# Patient Record
Sex: Female | Born: 1980 | Race: White | Hispanic: No | Marital: Single | Smoking: Never smoker
Health system: Southern US, Community
[De-identification: ages and names within clinical notes are randomized; demographics above are authoritative.]

## PROBLEM LIST (undated history)

## (undated) DIAGNOSIS — E669 Obesity, unspecified: Secondary | ICD-10-CM

## (undated) DIAGNOSIS — I10 Essential (primary) hypertension: Secondary | ICD-10-CM

## (undated) DIAGNOSIS — Z87442 Personal history of urinary calculi: Secondary | ICD-10-CM

## (undated) DIAGNOSIS — J40 Bronchitis, not specified as acute or chronic: Secondary | ICD-10-CM

## (undated) HISTORY — PX: TONSILLECTOMY: SUR1361

---
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
## 1998-01-24 ENCOUNTER — Ambulatory Visit (HOSPITAL_COMMUNITY): Admission: RE | Admit: 1998-01-24 | Discharge: 1998-01-24 | Payer: Self-pay | Admitting: Obstetrics and Gynecology

## 1998-02-19 ENCOUNTER — Inpatient Hospital Stay (HOSPITAL_COMMUNITY): Admission: AD | Admit: 1998-02-19 | Discharge: 1998-03-08 | Payer: Self-pay | Admitting: Obstetrics and Gynecology

## 1998-03-06 ENCOUNTER — Encounter (HOSPITAL_COMMUNITY): Admission: RE | Admit: 1998-03-06 | Discharge: 1998-06-04 | Payer: Self-pay | Admitting: Obstetrics and Gynecology

## 2003-01-20 ENCOUNTER — Emergency Department (HOSPITAL_COMMUNITY): Admission: AD | Admit: 2003-01-20 | Discharge: 2003-01-20 | Payer: Self-pay | Admitting: Family Medicine

## 2003-01-22 ENCOUNTER — Emergency Department (HOSPITAL_COMMUNITY): Admission: AD | Admit: 2003-01-22 | Discharge: 2003-01-22 | Payer: Self-pay | Admitting: Family Medicine

## 2003-01-29 ENCOUNTER — Emergency Department (HOSPITAL_COMMUNITY): Admission: AD | Admit: 2003-01-29 | Discharge: 2003-01-29 | Payer: Self-pay | Admitting: Family Medicine

## 2003-02-28 ENCOUNTER — Emergency Department (HOSPITAL_COMMUNITY): Admission: AD | Admit: 2003-02-28 | Discharge: 2003-02-28 | Payer: Self-pay | Admitting: Family Medicine

## 2003-07-01 ENCOUNTER — Emergency Department (HOSPITAL_COMMUNITY): Admission: EM | Admit: 2003-07-01 | Discharge: 2003-07-01 | Payer: Self-pay | Admitting: Family Medicine

## 2005-06-18 ENCOUNTER — Other Ambulatory Visit: Admission: RE | Admit: 2005-06-18 | Discharge: 2005-06-18 | Payer: Self-pay | Admitting: Internal Medicine

## 2005-10-23 ENCOUNTER — Emergency Department (HOSPITAL_COMMUNITY): Admission: EM | Admit: 2005-10-23 | Discharge: 2005-10-23 | Payer: Self-pay | Admitting: Emergency Medicine

## 2007-04-06 ENCOUNTER — Emergency Department (HOSPITAL_COMMUNITY): Admission: EM | Admit: 2007-04-06 | Discharge: 2007-04-06 | Payer: Self-pay | Admitting: Family Medicine

## 2008-01-25 ENCOUNTER — Emergency Department (HOSPITAL_COMMUNITY): Admission: EM | Admit: 2008-01-25 | Discharge: 2008-01-25 | Payer: Self-pay | Admitting: Emergency Medicine

## 2009-10-05 ENCOUNTER — Emergency Department (HOSPITAL_COMMUNITY): Admission: EM | Admit: 2009-10-05 | Discharge: 2009-10-05 | Payer: Self-pay | Admitting: Family Medicine

## 2009-12-14 ENCOUNTER — Emergency Department (HOSPITAL_COMMUNITY): Admission: EM | Admit: 2009-12-14 | Discharge: 2009-12-15 | Payer: Self-pay | Admitting: Emergency Medicine

## 2010-05-23 LAB — POCT URINALYSIS DIP (DEVICE)
Glucose, UA: NEGATIVE mg/dL
Hgb urine dipstick: NEGATIVE
Protein, ur: NEGATIVE mg/dL
Specific Gravity, Urine: >=1.03 (ref 1.005–1.030)
pH: 5.5 (ref 5.0–8.0)

## 2010-05-23 LAB — POCT I-STAT, CHEM 8
BUN: 15 mg/dL (ref 6–23)
Creatinine, Ser: 1.2 mg/dL (ref 0.4–1.2)
Hemoglobin: 13.9 g/dL (ref 12.0–15.0)
Potassium: 4.1 mEq/L (ref 3.5–5.1)
TCO2: 28 mmol/L (ref 0–100)

## 2010-05-23 LAB — POCT PREGNANCY, URINE: Preg Test, Ur: NEGATIVE

## 2010-07-24 NOTE — Consult Note (Signed)
NAME:  [name], [name] NO.:  [id number]   MEDICAL RECORD NO.:  [id number]          PATIENT TYPE:  EMS   LOCATION:  [facility]                         FACILITY:  [facility]   PHYSICIAN:  Kristine Garbe. Ezzard Standing, M.D.DATE OF BIRTH:  1980-11-18   DATE OF CONSULTATION:  10/23/2005  DATE OF DISCHARGE:  10/23/2005                                   CONSULTATION   REASON FOR CONSULTATION:  Patient with left ear laceration.  Barbara Giles  is a 30 year old female who was hit in left side of her head and left ear  with a golf club accidentally.  She sustained a through-and-through  laceration of the left ear measuring approximately 3-1/2 cm in size.  Cartilage was exposed and torn.  I was called to repair the left ear  laceration.   DESCRIPTION OF PROCEDURE:  Left ear was cleaned and prepped with Betadine  solution.  The ear was then injected with 3 mL of Xylocaine with  epinephrine.  The cartilage was reapproximated with a single 6-0 Vicryl  suture.  The skin edges were then reapproximated with 6-0 nylon sutures.  Jaretssi tolerated this well.  Bacitracin ointment and a mastoid-type  dressing was applied to the ear for light pressure.  She tolerated this well  and was discharged home subsequently.  She is given Keflex 500 mg b.i.d. for  5 days, Tylenol p.r.n. pain.  Will have a follow up in my offices in 6 days  for recheck and have the sutures removed.           ______________________________  Kristine Garbe Ezzard Standing, M.D.     CEN/MEDQ  D:  10/23/2005  T:  10/23/2005  Job:  [id number]

## 2010-09-07 ENCOUNTER — Other Ambulatory Visit (HOSPITAL_COMMUNITY)
Admission: RE | Admit: 2010-09-07 | Discharge: 2010-09-07 | Disposition: A | Discharge status: Home / Self Care | Payer: Self-pay | Source: Ambulatory Visit | Attending: Family Medicine | Admitting: Family Medicine

## 2010-09-07 ENCOUNTER — Other Ambulatory Visit: Payer: Self-pay | Admitting: Family Medicine

## 2010-09-07 DIAGNOSIS — Z124 Encounter for screening for malignant neoplasm of cervix: Secondary | ICD-10-CM | POA: Insufficient documentation

## 2010-09-07 DIAGNOSIS — Z1159 Encounter for screening for other viral diseases: Secondary | ICD-10-CM | POA: Insufficient documentation

## 2010-12-09 LAB — CBC
HCT: 39.3
Hemoglobin: 13.3
MCHC: 33.7
MCV: 83.6
Platelets: 291
RDW: 13

## 2010-12-09 LAB — POCT URINALYSIS DIP (DEVICE)
Nitrite: NEGATIVE
Urobilinogen, UA: 0.2

## 2010-12-09 LAB — POCT PREGNANCY, URINE: Preg Test, Ur: NEGATIVE

## 2010-12-09 LAB — COMPREHENSIVE METABOLIC PANEL
Albumin: 4.1
Alkaline Phosphatase: 62
Calcium: 9.3
Chloride: 102
Creatinine, Ser: 0.95
Glucose, Bld: 114 — ABNORMAL HIGH
Sodium: 135

## 2010-12-09 LAB — DIFFERENTIAL
Basophils Absolute: 0
Eosinophils Absolute: 0
Eosinophils Relative: 0
Lymphocytes Relative: 7 — ABNORMAL LOW
Monocytes Absolute: 0.3
Monocytes Relative: 2 — ABNORMAL LOW

## 2011-02-02 ENCOUNTER — Emergency Department (HOSPITAL_COMMUNITY)
Admission: EM | Admit: 2011-02-02 | Discharge: 2011-02-02 | Discharge status: Against Medical Advice | Payer: Self-pay | Attending: Emergency Medicine | Admitting: Emergency Medicine

## 2011-02-02 DIAGNOSIS — R0602 Shortness of breath: Secondary | ICD-10-CM | POA: Insufficient documentation

## 2011-02-21 ENCOUNTER — Emergency Department (HOSPITAL_COMMUNITY): Payer: PRIVATE HEALTH INSURANCE

## 2011-02-21 ENCOUNTER — Encounter: Payer: Self-pay | Admitting: *Deleted

## 2011-02-21 ENCOUNTER — Emergency Department (HOSPITAL_COMMUNITY)
Admission: EM | Admit: 2011-02-21 | Discharge: 2011-02-21 | Disposition: A | Discharge status: Home / Self Care | Payer: PRIVATE HEALTH INSURANCE | Attending: Emergency Medicine | Admitting: Emergency Medicine

## 2011-02-21 DIAGNOSIS — M25569 Pain in unspecified knee: Secondary | ICD-10-CM | POA: Insufficient documentation

## 2011-02-21 DIAGNOSIS — E669 Obesity, unspecified: Secondary | ICD-10-CM | POA: Insufficient documentation

## 2011-02-21 DIAGNOSIS — M25561 Pain in right knee: Secondary | ICD-10-CM

## 2011-02-21 DIAGNOSIS — S8000XA Contusion of unspecified knee, initial encounter: Secondary | ICD-10-CM | POA: Insufficient documentation

## 2011-02-21 DIAGNOSIS — Y9239 Other specified sports and athletic area as the place of occurrence of the external cause: Secondary | ICD-10-CM | POA: Insufficient documentation

## 2011-02-21 DIAGNOSIS — W010XXA Fall on same level from slipping, tripping and stumbling without subsequent striking against object, initial encounter: Secondary | ICD-10-CM | POA: Insufficient documentation

## 2011-02-21 DIAGNOSIS — S86919A Strain of unspecified muscle(s) and tendon(s) at lower leg level, unspecified leg, initial encounter: Secondary | ICD-10-CM

## 2011-02-21 DIAGNOSIS — Y9354 Activity, bowling: Secondary | ICD-10-CM | POA: Insufficient documentation

## 2011-02-21 DIAGNOSIS — S838X9A Sprain of other specified parts of unspecified knee, initial encounter: Secondary | ICD-10-CM | POA: Insufficient documentation

## 2011-02-21 MED ORDER — OXYCODONE-ACETAMINOPHEN 5-325 MG PO TABS: 2.0000 | ORAL_TABLET | ORAL | Status: AC | PRN

## 2011-02-21 MED ORDER — OXYCODONE-ACETAMINOPHEN 5-325 MG PO TABS
2.0000 | ORAL_TABLET | Freq: Once | ORAL | Status: AC
  Administered 2011-02-21: 2 via ORAL
  Filled 2011-02-21: qty 2

## 2011-02-21 NOTE — ED Notes (Signed)
Patient transported to X-ray 

## 2011-02-21 NOTE — ED Notes (Signed)
Patient fell PTA onto her back and injured her right knee.  Patient states that she is not able to bear weight on that right knee.  When she stands up, " it feels like jelly."

## 2011-02-21 NOTE — ED Provider Notes (Signed)
History     CSN: [id number] Arrival date & time: 02/21/2011  1:34 AM   First MD Initiated Contact with Patient 02/21/11 [phone]      Chief Complaint  Patient presents with  . Fall    (Consider location/radiation/quality/duration/timing/severity/associated sxs/prior treatment) HPI 30 yo female presents to the ER after having a fall while bowling and injuring her right knee.  Pt reports she felt it "go out" and fell down.  No prior h/o knee injuries.  Pt has been unable to bear weight on the knee due to "feeling like its jelly".  No swelling noted by patient.  Pain mainly on lateral aspect of knee and over patella.   History reviewed. No pertinent past medical history.  Past Surgical History  Procedure Date  . Tonsillectomy     History reviewed. No pertinent family history.  History  Substance Use Topics  . Smoking status: Never Smoker   . Smokeless tobacco: Not on file  . Alcohol Use: Yes    OB History    Grav Para Term Preterm Abortions TAB SAB Ect Mult Living                  Review of Systems  All other systems reviewed and are negative.    Allergies  Review of patient's allergies indicates no known allergies.  Home Medications   Current Outpatient Rx  Name Route Sig Dispense Refill  . NAPROXEN SODIUM 220 MG PO TABS Oral Take 220 mg by mouth 2 (two) times daily with a meal. For headache     . OXYCODONE-ACETAMINOPHEN 5-325 MG PO TABS Oral Take 2 tablets by mouth every 4 (four) hours as needed for pain. 30 tablet 0    BP 116/68  Pulse 94  Temp(Src) 98.2 F (36.8 C) (Oral)  Resp 20  SpO2 96%  LMP 01/27/2011  Physical Exam  Constitutional: She is oriented to person, place, and time.       Obese female in mild distress secondary to pain  HENT:  Head: Normocephalic and atraumatic.  Eyes: Conjunctivae and EOM are normal.  Musculoskeletal:       Right knee with 2 cm area of ecchymosis to lateral aspect.  No effusion, no deformity noted, no crepitus on  examination.  Normal ROM, but painful at endpoints of extension/flexion.  Normal anterior/posterior drawer.  No pain with varus/valgus stress.  Unable to complete lachmans due to body habitus and patient tolerance.  No patellar deformity no pain over patellar tendon.  Mild lateral joint line tenderness  Neurological: She is alert and oriented to person, place, and time. She exhibits normal muscle tone. Coordination normal.  Skin: Skin is warm and dry. No rash noted. No erythema. No pallor.  Psychiatric: She has a normal mood and affect. Her behavior is normal. Judgment and thought content normal.    ED Course  Procedures (including critical care time)  Labs Reviewed - No data to display Dg Knee Complete 4 Views Right  02/21/2011  *RADIOLOGY REPORT*  Clinical Data: Right knee pain status post injury  RIGHT KNEE - COMPLETE 4+ VIEW  Comparison: None.  Findings: No displaced acute fracture or dislocation identified. No aggressive appearing osseous lesion.  No joint effusion.  IMPRESSION: No acute osseous abnormality. If clinical concern for a fracture persists, recommend a repeat radiograph in 5-10 days to evaluate for interval change or callus formation.  Original Report Authenticated By: Waneta Martins, M.D.     1. Knee strain   2. Pain  in right knee       MDM  30 yo female with right knee injury after fall.  Xrays negative.  Will place in knee immobilizer, treat pain, and have her follow up with orthopedics or pcm for further workup and possible mri if pain should continue.  Pt updated on findings and plan        Olivia Mackie, MD 02/21/11 [phone]

## 2011-02-21 NOTE — ED Notes (Signed)
Pt shows no sign of neuro deficits 

## 2011-05-13 ENCOUNTER — Ambulatory Visit (INDEPENDENT_AMBULATORY_CARE_PROVIDER_SITE_OTHER): Payer: 59 | Admitting: Family Medicine

## 2011-05-13 VITALS — BP 139/83 | HR 109 | Temp 99.3°F | Resp 18 | Ht 60.75 in | Wt 242.0 lb

## 2011-05-13 DIAGNOSIS — B9789 Other viral agents as the cause of diseases classified elsewhere: Secondary | ICD-10-CM

## 2011-05-13 DIAGNOSIS — J029 Acute pharyngitis, unspecified: Secondary | ICD-10-CM

## 2011-05-13 DIAGNOSIS — B349 Viral infection, unspecified: Secondary | ICD-10-CM

## 2011-05-13 LAB — POCT INFLUENZA A/B: Influenza B, POC: NEGATIVE

## 2011-05-13 NOTE — Patient Instructions (Signed)

## 2011-05-13 NOTE — Progress Notes (Signed)
Subjective: Patient woke up this morning with fever and sore throat. She has chills and some congestion. She felt a little scratch in her throat yesterday. Has a history of having had a lot of strep in the past.  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 TMs are normal. Throat erythematous without exudate. Strep screen is taken. Next couple without significant nodes. Chest clear. Heart regular without murmurs. Patient is obese.  Assessment: Pharyngitis  Plan: Strep screen & treatment accordingly. If strep is negative I think I will do a flu test on her.  Results for orders placed in visit on 05/13/11  POCT RAPID STREP A (OFFICE)      Component Value Range   Rapid Strep A Screen Negative  Negative

## 2011-05-15 ENCOUNTER — Ambulatory Visit (INDEPENDENT_AMBULATORY_CARE_PROVIDER_SITE_OTHER): Payer: 59 | Admitting: Family Medicine

## 2011-05-15 ENCOUNTER — Telehealth: Payer: Self-pay

## 2011-05-15 VITALS — BP 124/81 | HR 92 | Temp 97.8°F | Resp 16 | Ht 61.0 in | Wt 241.0 lb

## 2011-05-15 DIAGNOSIS — R059 Cough, unspecified: Secondary | ICD-10-CM

## 2011-05-15 DIAGNOSIS — B349 Viral infection, unspecified: Secondary | ICD-10-CM

## 2011-05-15 DIAGNOSIS — R05 Cough: Secondary | ICD-10-CM

## 2011-05-15 DIAGNOSIS — J029 Acute pharyngitis, unspecified: Secondary | ICD-10-CM

## 2011-05-15 DIAGNOSIS — J40 Bronchitis, not specified as acute or chronic: Secondary | ICD-10-CM

## 2011-05-15 LAB — POCT CBC
MCH, POC: 26.7 pg — AB (ref 27–31.2)
MCHC: 32 g/dL (ref 31.8–35.4)
MCV: 83.4 fL (ref 80–97)
MID (cbc): 0.9 (ref 0–0.9)
POC Granulocyte: 9.5 — AB (ref 2–6.9)
POC LYMPH PERCENT: 20.3 %L (ref 10–50)
POC MID %: 6.8 %M (ref 0–12)
Platelet Count, POC: 341 10*3/uL (ref 142–424)
RDW, POC: 13.9 %

## 2011-05-15 MED ORDER — ACETAMINOPHEN-CODEINE 300-30 MG PO TABS: ORAL_TABLET | ORAL | Status: DC

## 2011-05-15 MED ORDER — BENZONATATE 100 MG PO CAPS: 100.0000 mg | ORAL_CAPSULE | Freq: Three times a day (TID) | ORAL | Status: AC | PRN

## 2011-05-15 MED ORDER — AMOXICILLIN 500 MG PO CAPS: 1000.0000 mg | ORAL_CAPSULE | Freq: Two times a day (BID) | ORAL | Status: AC

## 2011-05-15 NOTE — Telephone Encounter (Signed)
Pt states saw dr hopper recently for throat and was told to call back if not better. Pt states not better. Pt would also like to know if the in office strep test was sent out for culture and if so are the results back?  Best: [phone] bf

## 2011-05-15 NOTE — Progress Notes (Signed)
Subjective: Patient was here 2 days ago with a pharyngitis. Strep screen and flu tests were negative. She had viral sounding symptoms at that time, and I did not do a full throat culture on her. Her mother was asking whether I done a throat culture or not. She has had terrible throat pain for the past 2 days coughing a lot and keeping her awake.  Objective overweight white female coughing. Her TMs are normal. Throat erythematous without exudate. Strep screen and throat culture taken. Nose is a little congested. Neck supple without nodes. Chest is clear to auscultation without rhonchi rales or wheezes. Heart regular without murmurs.  Profile mother has had recent recent pancreatic surgery and splenectomy, has myasthenia gravis, and is somewhat immunocompromised so they're worried about her being sick.  Assessment: Viral syndrome with pharyngitis and cough.  Plan  wait for the tests and decided if any treatment is  indicated. A CBC and the strap is ordered.  Results for orders placed in visit on 05/15/11  POCT RAPID STREP A (OFFICE)      Component Value Range   Rapid Strep A Screen Negative  Negative   POCT CBC      Component Value Range   WBC 13.1 (*) 4.6 - 10.2 (K/uL)   Lymph, poc 2.7  0.6 - 3.4    POC LYMPH PERCENT 20.3  10 - 50 (%L)   MID (cbc) 0.9  0 - 0.9    POC MID % 6.8  0 - 12 (%M)   POC Granulocyte 9.5 (*) 2 - 6.9    Granulocyte percent 72.9  37 - 80 (%G)   RBC 4.57  4.04 - 5.48 (M/uL)   Hemoglobin 12.2  12.2 - 16.2 (g/dL)   HCT, POC 09.8  11.9 - 47.9 (%)   MCV 83.4  80 - 97 (fL)   MCH, POC 26.7 (*) 27 - 31.2 (pg)   MCHC 32  31.8 - 35.4 (g/dL)   RDW, POC 14.7     Platelet Count, POC 341  142 - 424 (K/uL)   MPV 7.8  0 - 99.8 (fL)

## 2011-05-15 NOTE — Patient Instructions (Addendum)
Viral Pharyngitis Viral pharyngitis is a viral infection that produces redness, pain, and swelling (inflammation) of the throat. It can spread from person to person (contagious). CAUSES Viral pharyngitis is caused by inhaling a large amount of certain germs called viruses. Many different viruses cause viral pharyngitis. SYMPTOMS Symptoms of viral pharyngitis include:  Sore throat.   Tiredness.   Stuffy nose.   Low-grade fever.   Congestion.   Cough.  TREATMENT Treatment includes rest, drinking plenty of fluids, and the use of over-the-counter medication (approved by your caregiver). HOME CARE INSTRUCTIONS   Drink enough fluids to keep your urine clear or pale yellow.   Eat soft, cold foods such as ice cream, frozen ice pops, or gelatin dessert.   Gargle with warm salt water (1 tsp salt per 1 qt of water).   If over age 65, throat lozenges may be used safely.   Only take over-the-counter or prescription medicines for pain, discomfort, or fever as directed by your caregiver. Do not take aspirin.  To help prevent spreading viral pharyngitis to others, avoid:  Mouth-to-mouth contact with others.   Sharing utensils for eating and drinking.   Coughing around others.  SEEK MEDICAL CARE IF:   You are better in a few days, then become worse.   You have a fever or pain not helped by pain medicines.   There are any other changes that concern you.  Document Released: 12/02/2004 Document Revised: 02/11/2011 Document Reviewed: 04/30/2010 [facility] Patient Information 2012 [place], [place].   For the pain take: Tylenol maximum of 4000 mg in 24 hours which would be 2 every 6 hours Either Advil 2 twice daily or Ibuprofen 3-4 pills 3 times daily

## 2011-05-16 ENCOUNTER — Telehealth: Payer: Self-pay

## 2011-05-16 NOTE — Telephone Encounter (Signed)
We did not send out a test for mono. Pt notified

## 2011-05-16 NOTE — Telephone Encounter (Signed)
Yes we did send out a throat culture but it went out at noon today. Advised pt to give it at least 72 hrs.

## 2011-05-16 NOTE — Telephone Encounter (Signed)
MOM WANTS TO KNOW IF HER DAUGHTER WAS TESTED FOR MONO  PLEASE CALL

## 2011-05-18 ENCOUNTER — Encounter: Payer: Self-pay | Admitting: *Deleted

## 2011-05-18 LAB — CULTURE, GROUP A STREP

## 2011-12-27 ENCOUNTER — Emergency Department (INDEPENDENT_AMBULATORY_CARE_PROVIDER_SITE_OTHER)
Admission: EM | Admit: 2011-12-27 | Discharge: 2011-12-27 | Disposition: A | Discharge status: Home / Self Care | Payer: 59 | Source: Home / Self Care | Attending: Emergency Medicine | Admitting: Emergency Medicine

## 2011-12-27 ENCOUNTER — Encounter (HOSPITAL_COMMUNITY): Payer: Self-pay | Admitting: *Deleted

## 2011-12-27 ENCOUNTER — Emergency Department (INDEPENDENT_AMBULATORY_CARE_PROVIDER_SITE_OTHER): Payer: 59

## 2011-12-27 DIAGNOSIS — N200 Calculus of kidney: Secondary | ICD-10-CM

## 2011-12-27 LAB — POCT URINALYSIS DIP (DEVICE)
Glucose, UA: NEGATIVE mg/dL
Nitrite: NEGATIVE

## 2011-12-27 MED ORDER — TAMSULOSIN HCL 0.4 MG PO CAPS: 0.4000 mg | ORAL_CAPSULE | Freq: Every day | ORAL | Status: DC

## 2011-12-27 MED ORDER — ONDANSETRON HCL 4 MG/2ML IJ SOLN
INTRAMUSCULAR | Status: AC
  Filled 2011-12-27: qty 2

## 2011-12-27 MED ORDER — HYDROMORPHONE HCL PF 1 MG/ML IJ SOLN
INTRAMUSCULAR | Status: AC
  Filled 2011-12-27: qty 1

## 2011-12-27 MED ORDER — OXYCODONE-ACETAMINOPHEN 5-325 MG PO TABS: ORAL_TABLET | ORAL | Status: DC

## 2011-12-27 MED ORDER — ONDANSETRON HCL 4 MG/2ML IJ SOLN
4.0000 mg | Freq: Once | INTRAMUSCULAR | Status: AC
  Administered 2011-12-27: 4 mg via INTRAVENOUS

## 2011-12-27 MED ORDER — HYDROMORPHONE HCL PF 1 MG/ML IJ SOLN
2.0000 mg | Freq: Once | INTRAMUSCULAR | Status: AC
  Administered 2011-12-27: 2 mg via INTRAMUSCULAR

## 2011-12-27 MED ORDER — SODIUM CHLORIDE 0.9 % IV SOLN
INTRAVENOUS | Status: DC
  Administered 2011-12-27 (×2): via INTRAVENOUS

## 2011-12-27 MED ORDER — HYDROMORPHONE HCL PF 1 MG/ML IJ SOLN: 1.0000 mg | Freq: Once | INTRAMUSCULAR | Status: DC

## 2011-12-27 MED ORDER — KETOROLAC TROMETHAMINE 30 MG/ML IJ SOLN
INTRAMUSCULAR | Status: AC
  Filled 2011-12-27: qty 1

## 2011-12-27 MED ORDER — HYDROMORPHONE HCL PF 1 MG/ML IJ SOLN
INTRAMUSCULAR | Status: AC
  Filled 2011-12-27: qty 2

## 2011-12-27 MED ORDER — KETOROLAC TROMETHAMINE 30 MG/ML IJ SOLN
30.0000 mg | Freq: Once | INTRAMUSCULAR | Status: AC
  Administered 2011-12-27: 30 mg via INTRAVENOUS

## 2011-12-27 MED ORDER — ONDANSETRON 8 MG PO TBDP: 8.0000 mg | ORAL_TABLET | Freq: Three times a day (TID) | ORAL | Status: DC | PRN

## 2011-12-27 MED ORDER — HYDROMORPHONE HCL PF 1 MG/ML IJ SOLN
1.0000 mg | Freq: Once | INTRAMUSCULAR | Status: AC
  Administered 2011-12-27: 1 mg via INTRAVENOUS

## 2011-12-27 NOTE — ED Provider Notes (Signed)
Chief Complaint  Patient presents with  . Flank Pain    History of Present Illness:    The patient is a 31 year old female who presents today with sudden onset of left flank pain, beginning shortly after she awoke this morning. The pain is severe rated 10 over 10 in intensity. It radiates around to the left groin. She denies any injury to the back. The pain is not worse with movement. It's constant and severe. Associated with some nausea, vomiting, and diarrhea. She denies any urinary symptoms such as dysuria, frequency, or urgency. She denies any fever or chills. She's had no GYN complaints. She does have a history of kidney stones and passed one about a year ago on her own. She did not need to see a urologist at that time.  Review of Systems:  Other than noted above, the patient denies any of the following symptoms: Constitutional:  No fever, chills, fatigue, weight loss or anorexia. Lungs:  No cough or shortness of breath. Heart:  No chest pain, palpitations, syncope or edema.  No cardiac history. Abdomen:  No nausea, vomiting, hematememesis, melena, diarrhea, or hematochezia. GU:  No dysuria, frequency, urgency, or hematuria. Gyn:  No vaginal discharge, itching, abnormal bleeding, dyspareunia, or pelvic pain.  PMFSH:  Past medical history, family history, social history, meds, and allergies were reviewed along with nurse's notes.  No prior abdominal surgeries, past history of GI problems, STDs or GYN problems.  No history of aspirin or NSAID use.  No excessive alcohol intake.  Physical Exam:   Vital signs:  BP 138/76  Pulse 72  Temp 98.6 F (37 C) (Oral)  Resp 16  SpO2 100%  LMP 11/25/2011 Gen:  Alert, oriented, in no distress. Lungs:  Breath sounds clear and equal bilaterally.  No wheezes, rales or rhonchi. Heart:  Regular rhythm.  No gallops or murmers.   Abdomen:  Soft, flat, and non-distended. No tenderness, guarding, or rebound. No organomegaly or mass. Bowel sounds are normally  active. No CVA tenderness. Skin:  Clear, warm and dry.  No rash.  Labs:   Results for orders placed during the hospital encounter of 12/27/11  POCT PREGNANCY, URINE      Component Value Range   Preg Test, Ur NEGATIVE  NEGATIVE  POCT URINALYSIS DIP (DEVICE)      Component Value Range   Glucose, UA NEGATIVE  NEGATIVE mg/dL   Bilirubin Urine SMALL (*) NEGATIVE   Ketones, ur TRACE (*) NEGATIVE mg/dL   Specific Gravity, Urine >=1.030  1.005 - 1.030   Hgb urine dipstick MODERATE (*) NEGATIVE   pH 5.5  5.0 - 8.0   Protein, ur 30 (*) NEGATIVE mg/dL   Urobilinogen, UA 0.2  0.0 - 1.0 mg/dL   Nitrite NEGATIVE  NEGATIVE   Leukocytes, UA NEGATIVE  NEGATIVE     Radiology:  Dg Abd 1 View  12/27/2011  *RADIOLOGY REPORT*  Clinical Data: Acute onset left flank pain.  History of renal stones.  ABDOMEN - 1 VIEW  Comparison: CT abdomen pelvis 01/25/2008.  Findings: A 5 mm calcification is seen in the left anatomic pelvis. Additional phleboliths are seen in the lower anatomic pelvis. There may be a faint calcification projecting over the right renal outline.  Tiny calcification projects over the left renal outline, lower pole.  IMPRESSION:  1.  5 mm calcification in the left anatomic pelvis.  Further evaluation with CT abdomen pelvis without contrast would be helpful in assessment for urinary stones and/or obstruction, as clinically indicated.  2.  Bilateral renal stones.   Original Report Authenticated By: Reyes Ivan, M.D.    Course in Urgent Care Center:   An IV was started and the patient was given normal saline, 1 L over the course of about an hour. She was also given Dilaudid 1 mg IV, Zofran 4 mg IV, and Toradol 30 mg IV. The patient's pain improved but then came back. She was then given another 2 mg of blood IM. At that time appointment has been made for her to see a urologist this afternoon. Dr. Mena Goes at [facility] will be seeing her and so she was discharged on the medicines below to  followup with him.  Assessment:  The encounter diagnosis was Kidney stone.  Plan:   1.  The following meds were prescribed:   New Prescriptions   ONDANSETRON (ZOFRAN ODT) 8 MG DISINTEGRATING TABLET    Take 1 tablet (8 mg total) by mouth every 8 (eight) hours as needed for nausea.   OXYCODONE-ACETAMINOPHEN (PERCOCET) 5-325 MG PER TABLET    1 to 2 tablets every 6 hours as needed for pain.   TAMSULOSIN HCL (FLOMAX) 0.4 MG CAPS    Take 1 capsule (0.4 mg total) by mouth daily.   2.  The patient was instructed in symptomatic care and handouts were given. 3.  The patient was told to return if becoming worse in any way, if no better in 3 or 4 days, and given some red flag symptoms that would indicate earlier return.  Follow up:  The patient was told to follow up with Dr. Mena Goes at [facility] urology.   Reuben Likes, MD 12/27/11 1346

## 2011-12-27 NOTE — ED Notes (Signed)
l  Flank  Pain  Started  This  Am      Nausea       No  Vomiting       symptms  Started  This  Am     Pain  Scale  Is  10        Pt  Has  Had  Kidney  Stones  In  Past

## 2011-12-27 NOTE — ED Notes (Signed)
Pt  Will  Be   Seen  At    [facility]  At  3  Pm  Today

## 2011-12-27 NOTE — ED Notes (Signed)
Strainer  Given to pt

## 2011-12-27 NOTE — ED Notes (Signed)
Pt  Feeling  Much  Better  She  Was  Discharged     To  [facility]  -  Info  Was   Faxed  To  The  Office  Prior  To pts   Leaving

## 2012-04-09 ENCOUNTER — Emergency Department (HOSPITAL_COMMUNITY)
Admission: EM | Admit: 2012-04-09 | Discharge: 2012-04-09 | Disposition: A | Discharge status: Home / Self Care | Payer: Self-pay | Attending: Emergency Medicine | Admitting: Emergency Medicine

## 2012-04-09 ENCOUNTER — Encounter (HOSPITAL_COMMUNITY): Payer: Self-pay | Admitting: *Deleted

## 2012-04-09 DIAGNOSIS — J069 Acute upper respiratory infection, unspecified: Secondary | ICD-10-CM | POA: Insufficient documentation

## 2012-04-09 DIAGNOSIS — J029 Acute pharyngitis, unspecified: Secondary | ICD-10-CM | POA: Insufficient documentation

## 2012-04-09 DIAGNOSIS — R059 Cough, unspecified: Secondary | ICD-10-CM | POA: Insufficient documentation

## 2012-04-09 DIAGNOSIS — R05 Cough: Secondary | ICD-10-CM | POA: Insufficient documentation

## 2012-04-09 DIAGNOSIS — Z8709 Personal history of other diseases of the respiratory system: Secondary | ICD-10-CM | POA: Insufficient documentation

## 2012-04-09 HISTORY — DX: Bronchitis, not specified as acute or chronic: J40

## 2012-04-09 MED ORDER — AZITHROMYCIN 250 MG PO TABS: ORAL_TABLET | ORAL | Status: DC

## 2012-04-09 MED ORDER — ACETAMINOPHEN-CODEINE #3 300-30 MG PO TABS: 1.0000 | ORAL_TABLET | Freq: Four times a day (QID) | ORAL | Status: DC | PRN

## 2012-04-09 MED ORDER — METHYLPREDNISOLONE 4 MG PO KIT: PACK | ORAL | Status: DC

## 2012-04-09 NOTE — ED Notes (Signed)
Pt c/o nonprod cough, yellow nasal drainage, and congestion x 1 week. States has hx bronchitis - reports symptoms feels same.

## 2012-04-09 NOTE — ED Provider Notes (Signed)
History     CSN: [id number]  Arrival date & time 04/09/12  0931   First MD Initiated Contact with Patient 04/09/12 1104      Chief Complaint  Patient presents with  . Nasal Congestion    (Consider location/radiation/quality/duration/timing/severity/associated sxs/prior treatment) HPI   Barbara Giles is a 32 y.o. female complaining of phargitis, purulent rhinorrhea, dry, positional  Cough, nasal congestion. Denies fever, N/V/D, SOB, urinary symptoms, change in appetite or activity level, headache, sinus pain, or otalgia.   Past Medical History  Diagnosis Date  . Bronchitis     Past Surgical History  Procedure Date  . Tonsillectomy     No family history on file.  History  Substance Use Topics  . Smoking status: Never Smoker   . Smokeless tobacco: Never Used  . Alcohol Use: Yes     Comment: rarely    OB History    Grav Para Term Preterm Abortions TAB SAB Ect Mult Living                  Review of Systems  Constitutional: Negative for fever.  HENT: Positive for congestion, sore throat and rhinorrhea.   Respiratory: Positive for cough. Negative for shortness of breath.   Cardiovascular: Negative for chest pain.  Gastrointestinal: Negative for nausea, vomiting, abdominal pain and diarrhea.  All other systems reviewed and are negative.    Allergies  Review of patient's allergies indicates no known allergies.  Home Medications   Current Outpatient Rx  Name  Route  Sig  Dispense  Refill  . NAPROXEN SODIUM 220 MG PO TABS   Oral   Take 440 mg by mouth 2 (two) times daily as needed. For pain         . OXYCODONE-ACETAMINOPHEN 5-325 MG PO TABS   Oral   Take 0.5-2 tablets by mouth every 6 (six) hours as needed. For pain           BP 134/83  Pulse 83  Temp 97 F (36.1 C) (Oral)  Resp 20  SpO2 99%  LMP 03/24/2012  Physical Exam  Nursing note and vitals reviewed. Constitutional: She is oriented to person, place, and time. She appears  well-developed and well-nourished. No distress.  HENT:  Head: Normocephalic.  Right Ear: External ear normal.  Left Ear: External ear normal.  Mouth/Throat: Oropharynx is clear and moist. No oropharyngeal exudate.       No tonsillar hypertrophy or exudate. Mildly injected posterior pharynx. No tenderness to palpation of maxillary or frontal sinuses  Eyes: Conjunctivae normal and EOM are normal. Pupils are equal, round, and reactive to light.  Neck: Normal range of motion. Neck supple.       Shotty nontender anterior cervical lymphadenopathy  Cardiovascular: Normal rate and normal heart sounds.   Pulmonary/Chest: Effort normal and breath sounds normal. No stridor. No respiratory distress. She has no wheezes. She has no rales. She exhibits no tenderness.  Abdominal: Soft. Bowel sounds are normal. She exhibits no distension and no mass. There is no tenderness. There is no rebound and no guarding.  Musculoskeletal: Normal range of motion.  Lymphadenopathy:    She has cervical adenopathy.  Neurological: She is alert and oriented to person, place, and time.  Psychiatric: She has a normal mood and affect.    ED Course  Procedures (including critical care time)  Labs Reviewed - No data to display No results found.   1. URI (upper respiratory infection)       MDM  Uncomplicated URI does not seem to be progressing towards pneumonia. Her nasal drainage is purulent therefore I will treat with antibiotic   Filed Vitals:   04/09/12 0935  BP: 134/83  Pulse: 83  Temp: 97 F (36.1 C)  TempSrc: Oral  Resp: 20  SpO2: 99%     Pt verbalized understanding and agrees with care plan. Outpatient follow-up and return precautions given.    New Prescriptions   ACETAMINOPHEN-CODEINE (TYLENOL #3) 300-30 MG PER TABLET    Take 1-2 tablets by mouth every 6 (six) hours as needed (Cough).   AZITHROMYCIN (ZITHROMAX Z-PAK) 250 MG TABLET    2 po day one, then 1 daily x 4 days   METHYLPREDNISOLONE  (MEDROL DOSEPAK) 4 MG TABLET    As directed by package insert           Wynetta Emery, PA-C 04/09/12 1136

## 2012-04-11 ENCOUNTER — Encounter (HOSPITAL_COMMUNITY): Payer: Self-pay | Admitting: *Deleted

## 2012-04-11 ENCOUNTER — Emergency Department (INDEPENDENT_AMBULATORY_CARE_PROVIDER_SITE_OTHER)
Admission: EM | Admit: 2012-04-11 | Discharge: 2012-04-11 | Disposition: A | Discharge status: Home / Self Care | Payer: Self-pay | Source: Home / Self Care | Attending: Family Medicine | Admitting: Family Medicine

## 2012-04-11 DIAGNOSIS — J069 Acute upper respiratory infection, unspecified: Secondary | ICD-10-CM

## 2012-04-11 MED ORDER — IPRATROPIUM BROMIDE 0.06 % NA SOLN: 2.0000 | Freq: Four times a day (QID) | NASAL | Status: DC

## 2012-04-11 MED ORDER — AMOXICILLIN-POT CLAVULANATE 875-125 MG PO TABS: 1.0000 | ORAL_TABLET | Freq: Two times a day (BID) | ORAL | Status: DC

## 2012-04-11 NOTE — ED Provider Notes (Signed)
History     CSN: [id number]  Arrival date & time 04/11/12  1020   First MD Initiated Contact with Patient 04/11/12 1054      Chief Complaint  Patient presents with  . URI    (Consider location/radiation/quality/duration/timing/severity/associated sxs/prior treatment) Patient is a 32 y.o. female presenting with URI. The history is provided by the patient.  URI The primary symptoms include cough. Primary symptoms do not include fever, ear pain, sore throat, wheezing, abdominal pain, nausea or vomiting. The current episode started 3 to 5 days ago. This is a new problem. The problem has been gradually worsening.  Symptoms associated with the illness include congestion and rhinorrhea. Risk factors: seen 2/2 but not improving with rx.    Past Medical History  Diagnosis Date  . Bronchitis     Past Surgical History  Procedure Date  . Tonsillectomy     No family history on file.  History  Substance Use Topics  . Smoking status: Never Smoker   . Smokeless tobacco: Never Used  . Alcohol Use: Yes     Comment: rarely    OB History    Grav Para Term Preterm Abortions TAB SAB Ect Mult Living                  Review of Systems  Constitutional: Negative.  Negative for fever.  HENT: Positive for congestion and rhinorrhea. Negative for ear pain and sore throat.   Respiratory: Positive for cough. Negative for wheezing.   Gastrointestinal: Negative.  Negative for nausea, vomiting and abdominal pain.    Allergies  Review of patient's allergies indicates no known allergies.  Home Medications   Current Outpatient Rx  Name  Route  Sig  Dispense  Refill  . ACETAMINOPHEN-CODEINE #3 300-30 MG PO TABS   Oral   Take 1-2 tablets by mouth every 6 (six) hours as needed (Cough).   15 tablet   0   . AMOXICILLIN-POT CLAVULANATE 875-125 MG PO TABS   Oral   Take 1 tablet by mouth 2 (two) times daily.   20 tablet   0   . AZITHROMYCIN 250 MG PO TABS      2 po day one, then 1 daily x  4 days   5 tablet   0   . IPRATROPIUM BROMIDE 0.06 % NA SOLN   Nasal   Place 2 sprays into the nose 4 (four) times daily.   15 mL   1   . METHYLPREDNISOLONE 4 MG PO KIT      As directed by package insert   21 tablet   0   . NAPROXEN SODIUM 220 MG PO TABS   Oral   Take 440 mg by mouth 2 (two) times daily as needed. For pain         . OXYCODONE-ACETAMINOPHEN 5-325 MG PO TABS   Oral   Take 0.5-2 tablets by mouth every 6 (six) hours as needed. For pain           BP 129/82  Pulse 76  Temp 98.6 F (37 C) (Oral)  Resp 20  SpO2 96%  LMP 03/24/2012  Physical Exam  Nursing note and vitals reviewed. Constitutional: She is oriented to person, place, and time. She appears well-developed and well-nourished.  HENT:  Head: Normocephalic.  Right Ear: External ear normal.  Left Ear: External ear normal.  Nose: Mucosal edema and rhinorrhea present.  Mouth/Throat: Oropharynx is clear and moist.  Neck: Normal range of motion. Neck supple.  Cardiovascular: Normal rate, regular rhythm, normal heart sounds and intact distal pulses.   Pulmonary/Chest: Effort normal and breath sounds normal.  Lymphadenopathy:    She has no cervical adenopathy.  Neurological: She is alert and oriented to person, place, and time.  Skin: Skin is warm and dry.    ED Course  Procedures (including critical care time)  Labs Reviewed - No data to display No results found.   1. URI (upper respiratory infection)       MDM          Linna Hoff, MD 04/11/12 1343

## 2012-04-11 NOTE — ED Notes (Signed)
Pt  Seen   Several  Days  Ago    For  Glenford Peers  Was rx  meds   She  Has  Taken yet     Symptoms  Are  Worse        She  Reports  A  Non  Productive  Cough  For the  Most part

## 2012-04-12 NOTE — ED Provider Notes (Signed)
Medical screening examination/treatment/procedure(s) were performed by non-physician practitioner and as supervising physician I was immediately available for consultation/collaboration.   Gavin Pound. Burt Piatek, MD 04/12/12 1438

## 2012-12-28 ENCOUNTER — Encounter (HOSPITAL_COMMUNITY): Payer: Self-pay | Admitting: Emergency Medicine

## 2012-12-28 ENCOUNTER — Emergency Department (HOSPITAL_COMMUNITY)
Admission: EM | Admit: 2012-12-28 | Discharge: 2012-12-28 | Disposition: A | Discharge status: Home / Self Care | Payer: 59 | Attending: Emergency Medicine | Admitting: Emergency Medicine

## 2012-12-28 DIAGNOSIS — T1502XA Foreign body in cornea, left eye, initial encounter: Secondary | ICD-10-CM

## 2012-12-28 DIAGNOSIS — Y929 Unspecified place or not applicable: Secondary | ICD-10-CM | POA: Insufficient documentation

## 2012-12-28 DIAGNOSIS — S0502XA Injury of conjunctiva and corneal abrasion without foreign body, left eye, initial encounter: Secondary | ICD-10-CM

## 2012-12-28 DIAGNOSIS — Z79899 Other long term (current) drug therapy: Secondary | ICD-10-CM | POA: Insufficient documentation

## 2012-12-28 DIAGNOSIS — Z8709 Personal history of other diseases of the respiratory system: Secondary | ICD-10-CM | POA: Insufficient documentation

## 2012-12-28 DIAGNOSIS — Y939 Activity, unspecified: Secondary | ICD-10-CM | POA: Insufficient documentation

## 2012-12-28 DIAGNOSIS — Z792 Long term (current) use of antibiotics: Secondary | ICD-10-CM | POA: Insufficient documentation

## 2012-12-28 DIAGNOSIS — T1500XA Foreign body in cornea, unspecified eye, initial encounter: Secondary | ICD-10-CM | POA: Insufficient documentation

## 2012-12-28 DIAGNOSIS — T1590XA Foreign body on external eye, part unspecified, unspecified eye, initial encounter: Secondary | ICD-10-CM | POA: Insufficient documentation

## 2012-12-28 DIAGNOSIS — S058X9A Other injuries of unspecified eye and orbit, initial encounter: Secondary | ICD-10-CM | POA: Insufficient documentation

## 2012-12-28 MED ORDER — TOBRAMYCIN 0.3 % OP SOLN: 1.0000 [drp] | OPHTHALMIC | Status: DC

## 2012-12-28 MED ORDER — FLUORESCEIN SODIUM 1 MG OP STRP
1.0000 | ORAL_STRIP | Freq: Once | OPHTHALMIC | Status: AC
  Administered 2012-12-28: 1 via OPHTHALMIC
  Filled 2012-12-28: qty 1

## 2012-12-28 MED ORDER — TETRACAINE HCL 0.5 % OP SOLN
2.0000 [drp] | Freq: Once | OPHTHALMIC | Status: AC
  Administered 2012-12-28: 2 [drp] via OPHTHALMIC
  Filled 2012-12-28: qty 2

## 2012-12-28 MED ORDER — OXYCODONE-ACETAMINOPHEN 10-325 MG PO TABS: 1.0000 | ORAL_TABLET | ORAL | Status: DC | PRN

## 2012-12-28 NOTE — ED Notes (Signed)
PA at bedside.

## 2012-12-28 NOTE — ED Provider Notes (Signed)
Medical screening examination/treatment/procedure(s) were performed by non-physician practitioner and as supervising physician I was immediately available for consultation/collaboration.   Sheikh Leverich, MD 12/28/12 0415 

## 2012-12-28 NOTE — ED Notes (Signed)
Pt reports she first noticed a small white spec on her retine of the left eye when she was getting dressed for bed tonight. Pt states she flushed her eye at least five times but it would not get out, pt states she has pain only when she closes her eye, pt denies any previous problems with her eyes or vision. Pt states her vision is perfectly normal right now. Pt states her concern is the pain.

## 2012-12-28 NOTE — ED Notes (Signed)
L eye: 20/25; R Eye: 20/20. PA informed.

## 2012-12-28 NOTE — ED Provider Notes (Signed)
CSN: [id number]     Arrival date & time 12/28/12  0100 History   First MD Initiated Contact with Patient 12/28/12 0109     Chief Complaint  Patient presents with  . Eye Pain   HPI  History provided by the patient. Patient is a 32 year old female presenting with concerns for foreign body in the left eye. Patient states she was getting on her pajamas this evening when she had irritation to her left eye. She states she was able to look in the mirror and see a white speck over the cornea of arrival. She tried rinsing her eye several times but was unable to remove it. She has mild to moderate pain and irritation especially when she blinks. She denies any photophobia or vision change. Very slight redness of the eye. No drainage. No other aggravating or alleviating factors. No other associated symptoms.   Past Medical History  Diagnosis Date  . Bronchitis    Past Surgical History  Procedure Laterality Date  . Tonsillectomy     No family history on file. History  Substance Use Topics  . Smoking status: Never Smoker   . Smokeless tobacco: Never Used  . Alcohol Use: Yes     Comment: rarely   OB History   Grav Para Term Preterm Abortions TAB SAB Ect Mult Living                 Review of Systems  Eyes: Positive for pain and redness. Negative for photophobia and visual disturbance.  All other systems reviewed and are negative.    Allergies  Review of patient's allergies indicates no known allergies.  Home Medications   Current Outpatient Rx  Name  Route  Sig  Dispense  Refill  . acetaminophen-codeine (TYLENOL #3) 300-30 MG per tablet   Oral   Take 1-2 tablets by mouth every 6 (six) hours as needed (Cough).   15 tablet   0   . amoxicillin-clavulanate (AUGMENTIN) 875-125 MG per tablet   Oral   Take 1 tablet by mouth 2 (two) times daily.   20 tablet   0   . azithromycin (ZITHROMAX Z-PAK) 250 MG tablet      2 po day one, then 1 daily x 4 days   5 tablet   0   .  ipratropium (ATROVENT) 0.06 % nasal spray   Nasal   Place 2 sprays into the nose 4 (four) times daily.   15 mL   1   . methylPREDNISolone (MEDROL DOSEPAK) 4 MG tablet      As directed by package insert   21 tablet   0   . naproxen sodium (ANAPROX) 220 MG tablet   Oral   Take 440 mg by mouth 2 (two) times daily as needed. For pain         . oxyCODONE-acetaminophen (PERCOCET/ROXICET) 5-325 MG per tablet   Oral   Take 0.5-2 tablets by mouth every 6 (six) hours as needed. For pain          There were no vitals taken for this visit. Physical Exam  Nursing note and vitals reviewed. Constitutional: She is oriented to person, place, and time. She appears well-developed and well-nourished. No distress.  HENT:  Head: Normocephalic.  Eyes: Conjunctivae and EOM are normal. Pupils are equal, round, and reactive to light. Foreign body present in the left eye.  Slit lamp exam:      The left eye shows foreign body. The left eye shows no corneal  flare, no corneal ulcer, no hyphema and no hypopyon.    Small white foreign body to the lateral left cornea area.  20/25 left eye, 20/20 right eye, 20/20 both eyes  Cardiovascular: Normal rate and regular rhythm.   Pulmonary/Chest: Effort normal and breath sounds normal.  Abdominal: Soft.  Neurological: She is alert and oriented to person, place, and time.  Skin: Skin is warm and dry. No rash noted.  Psychiatric: She has a normal mood and affect. Her behavior is normal.    ED Course  FOREIGN BODY REMOVAL Date/Time: 12/28/2012 1:29 AM Performed by: Angus Seller Authorized by: Angus Seller Consent: Verbal consent obtained. Risks and benefits: risks, benefits and alternatives were discussed Consent given by: patient Patient identity confirmed: verbally with patient Time out: Immediately prior to procedure a "time out" was called to verify the correct patient, procedure, equipment, support staff and site/side marked as required. Body  area: eye Location details: left cornea Local anesthetic: tetracaine drops Localization method: slit lamp Removal mechanism: 25-gauge needle Eye examined with fluorescein. Fluorescein uptake. Corneal abrasion size: small Corneal abrasion location: lateral No residual rust ring present. Depth: superficial Complexity: simple 1 objects recovered. Post-procedure assessment: foreign body removed Patient tolerance: Patient tolerated the procedure well with no immediate complications.     COORDINATION OF CARE:  Nursing notes reviewed. Vital signs reviewed. Initial pt interview and examination performed.   1:11 AM-patient seen and evaluated. Small white foreign body removed from the lateral left cornea. Very small damage to the corneal tissue. Prescription for antibiotic drops provided as well as ophthalmology referral.  Pt agrees with plan.   Treatment plan initiated: Medications  tetracaine (PONTOCAINE) 0.5 % ophthalmic solution 2 drop (2 drops Both Eyes Given by Other 12/28/12 0118)  fluorescein ophthalmic strip 1 strip (1 strip Both Eyes Given by Other 12/28/12 0118)     MDM   1. Corneal foreign body, left, initial encounter   2. Corneal abrasion, left, initial encounter           Angus Seller, PA-C 12/28/12 1610

## 2013-01-29 ENCOUNTER — Encounter (HOSPITAL_COMMUNITY): Payer: Self-pay | Admitting: Emergency Medicine

## 2013-01-29 ENCOUNTER — Emergency Department (HOSPITAL_COMMUNITY): Payer: Self-pay

## 2013-01-29 ENCOUNTER — Emergency Department (HOSPITAL_COMMUNITY)
Admission: EM | Admit: 2013-01-29 | Discharge: 2013-01-29 | Disposition: A | Discharge status: Home / Self Care | Payer: Self-pay | Attending: Emergency Medicine | Admitting: Emergency Medicine

## 2013-01-29 DIAGNOSIS — M25562 Pain in left knee: Secondary | ICD-10-CM

## 2013-01-29 DIAGNOSIS — R52 Pain, unspecified: Secondary | ICD-10-CM | POA: Insufficient documentation

## 2013-01-29 DIAGNOSIS — Z8709 Personal history of other diseases of the respiratory system: Secondary | ICD-10-CM | POA: Insufficient documentation

## 2013-01-29 DIAGNOSIS — M25579 Pain in unspecified ankle and joints of unspecified foot: Secondary | ICD-10-CM | POA: Insufficient documentation

## 2013-01-29 MED ORDER — OXYCODONE-ACETAMINOPHEN 5-325 MG PO TABS: 1.0000 | ORAL_TABLET | Freq: Four times a day (QID) | ORAL | Status: DC | PRN

## 2013-01-29 MED ORDER — NAPROXEN 500 MG PO TABS: 500.0000 mg | ORAL_TABLET | Freq: Two times a day (BID) | ORAL | Status: DC

## 2013-01-29 NOTE — ED Provider Notes (Signed)
CSN: [id number]     Arrival date & time 01/29/13  0725 History  This chart was scribed for non-physician practitioner Arthor Captain, PA-C,  working with Dagmar Hait, MD by Dorothey Baseman, ED Scribe. This patient was seen in room TR06C/TR06C and the patient's care was started at 9:05 AM.    Chief Complaint  Patient presents with  . Knee Pain   The history is provided by the patient. No language interpreter was used.   HPI Comments: Barbara Giles is a 32 y.o. female who presents to the Emergency Department complaining of an intermittent, sharp pain to the left knee onset about 10 days ago that has been progressively worsening and is exacerbated with walking, twisting, movement, and is worse at night. She reports taking Percocet at home without relief. She denies swelling or ecchymosis to the area. She reports a history of a torn meniscus in the same knee and she states that she has not followed up for it recently. Patient reports that she works at [employer] and a [employer] and is on her feet for the majority of the day. Patient has no other pertinent medical history.   Past Medical History  Diagnosis Date  . Bronchitis    Past Surgical History  Procedure Laterality Date  . Tonsillectomy     No family history on file. History  Substance Use Topics  . Smoking status: Never Smoker   . Smokeless tobacco: Never Used  . Alcohol Use: Yes     Comment: rarely   OB History   Grav Para Term Preterm Abortions TAB SAB Ect Mult Living                 Review of Systems  Musculoskeletal: Positive for arthralgias. Negative for joint swelling.  Skin: Negative for color change.    Allergies  Review of patient's allergies indicates no known allergies.  Home Medications   Current Outpatient Rx  Name  Route  Sig  Dispense  Refill  . oxyCODONE-acetaminophen (PERCOCET/ROXICET) 5-325 MG per tablet   Oral   Take 0.5-2 tablets by mouth every 6 (six) hours as needed. For pain           LMP 01/29/2013  Physical Exam  Nursing note and vitals reviewed. Constitutional: She is oriented to person, place, and time. She appears well-developed and well-nourished. No distress.  HENT:  Head: Normocephalic and atraumatic.  Eyes: Conjunctivae are normal.  Neck: Normal range of motion. Neck supple.  Pulmonary/Chest: Effort normal. No respiratory distress.  Abdominal: She exhibits no distension.  Musculoskeletal: Normal range of motion.  Tenderness to palpation to the abductor insertion sites medially to the left knee. No swelling, crepitus, or instability in the knee.   Neurological: She is alert and oriented to person, place, and time.  Skin: Skin is warm and dry.  Psychiatric: She has a normal mood and affect. Her behavior is normal.    ED Course  Procedures (including critical care time)  COORDINATION OF CARE: 9:07 AM- Discussed negative x-ray results. Discuss that symptoms may be due to bursitis. Will discharge patient with Percocet. Advised patient to take anti-inflammatory medications, such as Aleve, at home to manage symptoms. Advised patient to follow up with the referred orthopaedist. Discussed treatment plan with patient at bedside and patient verbalized agreement.     Labs Review Labs Reviewed - No data to display  Imaging Review Dg Knee Complete 4 Views Left  01/29/2013   CLINICAL DATA:  Medial knee pain  EXAM: LEFT KNEE - COMPLETE 4+ VIEW  COMPARISON:  None.  FINDINGS: There is no evidence of fracture, dislocation, or joint effusion. There is no evidence of arthropathy or other focal bone abnormality. Soft tissues are unremarkable.  IMPRESSION: No acute osseous finding   Electronically Signed   By: Ruel Favors M.D.   On: 01/29/2013 08:30    EKG Interpretation   None       MDM   1. Knee pain, acute, left    With the knee pain.  I do suspect that she has a tibial collateral ligament bursitis.  Patient given pain medication and,  anti-inflammatories.  She is advised to follow up with orthopedics, rest ice and elevate the knee.  Return precautions discussed I personally performed the services described in this documentation, which was scribed in my presence. The recorded information has been reviewed and is accurate.     Arthor Captain, PA-C 01/29/13 1125

## 2013-01-29 NOTE — ED Provider Notes (Signed)
Medical screening examination/treatment/procedure(s) were performed by non-physician practitioner and as supervising physician I was immediately available for consultation/collaboration.  EKG Interpretation   None         William Lael Wetherbee, MD 01/29/13 1458 

## 2013-01-29 NOTE — ED Notes (Signed)
Patient states she thinks "i did something to my left knee".  Patient doesn't recall an injury, but claims has been hurting off / on since last Thursday.   Patient states sharp pain L knee 8/10 pain "when I move it the wrong way".

## 2013-03-10 ENCOUNTER — Emergency Department (HOSPITAL_COMMUNITY)
Admission: EM | Admit: 2013-03-10 | Discharge: 2013-03-10 | Disposition: A | Discharge status: Home / Self Care | Payer: Self-pay | Attending: Emergency Medicine | Admitting: Emergency Medicine

## 2013-03-10 ENCOUNTER — Encounter (HOSPITAL_COMMUNITY): Payer: Self-pay | Admitting: Emergency Medicine

## 2013-03-10 ENCOUNTER — Emergency Department (HOSPITAL_COMMUNITY): Payer: Self-pay

## 2013-03-10 DIAGNOSIS — E669 Obesity, unspecified: Secondary | ICD-10-CM | POA: Insufficient documentation

## 2013-03-10 DIAGNOSIS — R51 Headache: Secondary | ICD-10-CM | POA: Insufficient documentation

## 2013-03-10 DIAGNOSIS — Z8709 Personal history of other diseases of the respiratory system: Secondary | ICD-10-CM | POA: Insufficient documentation

## 2013-03-10 DIAGNOSIS — R519 Headache, unspecified: Secondary | ICD-10-CM

## 2013-03-10 DIAGNOSIS — F411 Generalized anxiety disorder: Secondary | ICD-10-CM | POA: Insufficient documentation

## 2013-03-10 HISTORY — DX: Obesity, unspecified: E66.9

## 2013-03-10 MED ORDER — DIPHENHYDRAMINE HCL 50 MG/ML IJ SOLN: 25.0000 mg | Freq: Once | INTRAMUSCULAR | Status: DC

## 2013-03-10 MED ORDER — SODIUM CHLORIDE 0.9 % IV BOLUS (SEPSIS)
1000.0000 mL | Freq: Once | INTRAVENOUS | Status: AC
  Administered 2013-03-10: 1000 mL via INTRAVENOUS

## 2013-03-10 MED ORDER — METOCLOPRAMIDE HCL 5 MG/ML IJ SOLN: 10.0000 mg | Freq: Once | INTRAMUSCULAR | Status: DC

## 2013-03-10 NOTE — ED Provider Notes (Signed)
CSN: [id number]     Arrival date & time 03/10/13  0037 History   First MD Initiated Contact with Patient 03/10/13 0319     Chief Complaint  Patient presents with  . Headache   (Consider location/radiation/quality/duration/timing/severity/associated sxs/prior Treatment) The history is provided by the patient and a relative.  Barbara Giles is a 33 y.o. female hx of bronchitis, obesity here with headaches. Intermittent headaches for 2 weeks. Its on the R side of her head and radiates to the back. Denies nausea or blurry vision. Denies neck pain or fever. Denies weakness.    Past Medical History  Diagnosis Date  . Bronchitis   . Obesity    Past Surgical History  Procedure Laterality Date  . Tonsillectomy     No family history on file. History  Substance Use Topics  . Smoking status: Never Smoker   . Smokeless tobacco: Never Used  . Alcohol Use: Yes     Comment: rarely   OB History   Grav Para Term Preterm Abortions TAB SAB Ect Mult Living                 Review of Systems  Neurological: Positive for headaches.  All other systems reviewed and are negative.    Allergies  Review of patient's allergies indicates no known allergies.  Home Medications   Current Outpatient Rx  Name  Route  Sig  Dispense  Refill  . naproxen sodium (ANAPROX) 220 MG tablet   Oral   Take 220 mg by mouth 2 (two) times daily as needed (for pain).          BP 143/64  Pulse 83  Temp(Src) 98 F (36.7 C) (Oral)  Resp 18  Ht 5' 0.5" (1.537 m)  Wt 250 lb (113.399 kg)  BMI 48.00 kg/m2  SpO2 99%  LMP 02/11/2013 Physical Exam  Nursing note and vitals reviewed. Constitutional: She is oriented to person, place, and time. She appears well-developed and well-nourished.  Slightly anxious   HENT:  Head: Normocephalic.  Right Ear: External ear normal.  Left Ear: External ear normal.  Mouth/Throat: Oropharynx is clear and moist.  Eyes: Conjunctivae are normal. Pupils are equal, round, and  reactive to light.  Neck: Normal range of motion. Neck supple.  Cardiovascular: Normal rate, regular rhythm and normal heart sounds.   Pulmonary/Chest: Breath sounds normal. No respiratory distress. She has no wheezes. She has no rales.  Abdominal: Soft. Bowel sounds are normal. She exhibits no distension. There is no tenderness. There is no rebound.  Musculoskeletal: Normal range of motion.  Neurological: She is alert and oriented to person, place, and time. No cranial nerve deficit. Coordination normal.  No pronator drift   Skin: Skin is warm and dry.  Psychiatric: She has a normal mood and affect. Her behavior is normal. Judgment and thought content normal.    ED Course  Procedures (including critical care time) Labs Review Labs Reviewed - No data to display Imaging Review Ct Head Wo Contrast  03/10/2013   CLINICAL DATA:  Severe pain behind right ear radiating to vertex and occipital region.  EXAM: CT HEAD WITHOUT CONTRAST  TECHNIQUE: Contiguous axial images were obtained from the base of the skull through the vertex without intravenous contrast.  COMPARISON:  Prior CT from 10/23/2005  FINDINGS: There is no acute intracranial hemorrhage or infarct. No mass lesion or midline shift. Gray-white matter differentiation is well maintained. Ventricles are normal in size without evidence of hydrocephalus. CSF containing spaces are  within normal limits. No extra-axial fluid collection.  The calvarium is intact.  Orbital soft tissues are within normal limits.  The paranasal sinuses and mastoid air cells are well pneumatized and free of fluid.  Scalp soft tissues are unremarkable.  IMPRESSION: Normal head CT with no acute intracranial process.   Electronically Signed   By: Rise Mu M.D.   On: 03/10/2013 04:33    EKG Interpretation   None       MDM  No diagnosis found. Barbara Giles is a 33 y.o. female here with headaches. Its new onset headaches so I ordered CT head which is  normal. Headache improved so refused medicines. Recommend excedrin, neuro f/u.      Richardean Canal, MD 03/10/13 [phone]

## 2013-03-10 NOTE — Discharge Instructions (Signed)
Take excedrin for headache.   Follow up with a neurologist.   Return to ER if you have severe headache, vomiting, weakness.

## 2013-03-10 NOTE — ED Notes (Signed)
Pt. reports intermittent right side headache for 2 weeks , denies injury , no nausea or blurred vision . No pain at triage .

## 2014-03-06 ENCOUNTER — Ambulatory Visit (INDEPENDENT_AMBULATORY_CARE_PROVIDER_SITE_OTHER): Payer: Managed Care, Other (non HMO) | Admitting: Family Medicine

## 2014-03-06 ENCOUNTER — Other Ambulatory Visit: Payer: Self-pay | Admitting: Radiology

## 2014-03-06 ENCOUNTER — Telehealth: Payer: Self-pay

## 2014-03-06 VITALS — BP 118/74 | HR 75 | Temp 98.0°F | Resp 18 | Ht 61.5 in | Wt 244.0 lb

## 2014-03-06 DIAGNOSIS — J209 Acute bronchitis, unspecified: Secondary | ICD-10-CM

## 2014-03-06 MED ORDER — HYDROCOD POLST-CPM POLST ER 5-4 MG PO CP12: 1.0000 | ORAL_CAPSULE | Freq: Two times a day (BID) | ORAL | Status: DC | PRN

## 2014-03-06 MED ORDER — HYDROCOD POLST-CPM POLST ER 10-8 MG PO CP12
ORAL_CAPSULE | ORAL | Status: DC
Start: 2014-03-06 — End: 2014-03-07

## 2014-03-06 NOTE — Patient Instructions (Signed)

## 2014-03-06 NOTE — Telephone Encounter (Signed)
Spoke to patient, the 5 mg tussionex tabs not available, but the 10-[facility] Patient will bring in the old Rx and exchange for the new one.

## 2014-03-06 NOTE — Telephone Encounter (Signed)
Sure, fine to switch to hydrocodone but pt will have to come back to pick it up and give back the tussicap prescription for destruction. Should add in mucinex DM bid as well.

## 2014-03-06 NOTE — Telephone Encounter (Signed)
Pt's mother called. Said that the Tussicaps are $196. She said that the pt has tried Tylenol #3 and Tessalon Perles without relief. Unable to take liquid cough syrup due to vomiting. Mom wants to know if we can call in Hydrocodone tablets for pt. Says the pharm recommended this. Says pt has Percocet 10/325 at home and will take a 1/2 of these tonight. Please advise. Thanks

## 2014-03-06 NOTE — Progress Notes (Deleted)
Subjective:  This chart was scribe for Norberto SorensonEva Shaw, MD by Angelene GiovanniEmmanuella Mensah, ED Scribe. The patient was seen in Room 8 and the patient's care was started at 6:08 PM.    Patient ID: Barbara LobeAllison E Giles, female    DOB: 06/23/1980, 33 y.o.   MRN: [id number] Chief Complaint  Patient presents with   Cough    x1 week clear mucous    Fatigue     HPI HPI Comments: Barbara Lobellison E [place] is a 33 y.o. female with a hx of bronchitis who presents to the Emergency Department complaining of a gradually worsening dry cough onset. She states that she had rhinorrhea 1 week ago which has since resolved. She reports associated fever and chills. She denies sore throat, congestion, and CP. She states that she has been trying pearls with no relief and she denies trying any other medication. She reports a sick contact at home with her daughter with similar symptoms and a nephew with streph throat. She states that she is not too fond of liquid medications.   Past Medical History  Diagnosis Date   Bronchitis    Obesity    Current Outpatient Prescriptions on File Prior to Visit  Medication Sig Dispense Refill   naproxen sodium (ANAPROX) 220 MG tablet Take 220 mg by mouth 2 (two) times daily as needed (for pain).     No current facility-administered medications on file prior to visit.    No Known Allergies    Review of Systems  Constitutional: Positive for fever and chills. Negative for diaphoresis and appetite change.  HENT: Negative for congestion, sore throat and trouble swallowing.   Eyes: Negative for pain and itching.  Respiratory: Positive for cough. Negative for shortness of breath.   Gastrointestinal: Negative for nausea, vomiting, abdominal pain and diarrhea.  Genitourinary: Negative for dysuria and frequency.  Musculoskeletal: Negative for back pain.  Skin: Negative for rash.  Neurological: Negative for headaches.  Psychiatric/Behavioral: Negative for hallucinations and confusion.         Objective:   Physical Exam  Constitutional: She is oriented to person, place, and time. She appears well-developed and well-nourished. No distress.  HENT:  Head: Normocephalic and atraumatic.  Mouth/Throat: Oropharynx is clear and moist.  Mid ear fusion bilaterally  Nasal mucosa is pink and healthy Mild erythema and clear post nasal drip  Eyes: Conjunctivae and EOM are normal.  Neck: No tracheal deviation present.  Normal thyroid.   Cardiovascular: Normal rate, regular rhythm, S2 normal and normal heart sounds.   Pulmonary/Chest: Effort normal. No respiratory distress.  Musculoskeletal: Normal range of motion.  Lymphadenopathy:       Head (right side): Submandibular and tonsillar adenopathy present.       Head (left side): Submandibular and tonsillar adenopathy present.    She has no cervical adenopathy.  Neurological: She is alert and oriented to person, place, and time.  Skin: Skin is warm and dry.  Psychiatric: She has a normal mood and affect. Her behavior is normal.  Nursing note and vitals reviewed.    BP 118/74 mmHg   Pulse 75   Temp(Src) 98 F (36.7 C) (Oral)   Resp 18   Ht 5' 1.5" (1.562 m)   Wt 244 lb (110.678 kg)   BMI 45.36 kg/m2   SpO2 99%   LMP 02/19/2014        Assessment & Plan:  6:14 PM- Pt advised of plan for treatment and pt agrees.  Pt instructed that if symptoms worsened or  if fever and chills worsened in 3 to 4 days, will call in a Z-pack.  Acute bronchitis, unspecified organism Pt adamant that she can't tolerate any cough syrup - all liquid meds make her vomit.  Requests cough suppressant pill and states the "tessalon and robitussin never work for her."  Pharmacy did not have below med and was to expensive for her to have them order so switched to vicodin for cough suppressent per pt's req - has worked well for her in past.  Meds ordered this encounter  Medications   DISCONTD: Hydrocod Polst-Chlorphen Polst 5-4 MG CP12    Sig: Take 1 tablet by mouth  2 (two) times daily as needed (cough and congestion).    Dispense:  20 each    Refill:  0    I personally performed the services described in this documentation, which was scribed in my presence. The recorded information has been reviewed and considered, and addended by me as needed.  Norberto SorensonEva Shaw, MD MPH

## 2014-03-07 MED ORDER — HYDROCODONE-ACETAMINOPHEN 5-325 MG PO TABS: 1.0000 | ORAL_TABLET | Freq: Four times a day (QID) | ORAL | Status: DC | PRN

## 2014-03-07 MED ORDER — MUCINEX DM MAXIMUM STRENGTH 60-1200 MG PO TB12: 1.0000 | ORAL_TABLET | Freq: Two times a day (BID) | ORAL | Status: DC

## 2014-03-07 NOTE — Telephone Encounter (Signed)
rx printed and put in [facility]igg's box

## 2014-03-07 NOTE — Telephone Encounter (Signed)
Notified pt who reported she has already started the Mucinex DM and will bring in the Tussicaps Rx to swap.

## 2014-03-11 ENCOUNTER — Telehealth: Payer: Self-pay

## 2014-03-11 MED ORDER — AZITHROMYCIN 250 MG PO TABS: ORAL_TABLET | ORAL | Status: DC

## 2014-03-11 NOTE — Progress Notes (Signed)
Subjective:  This chart was scribe for Norberto Sorenson, MD by Angelene Giovanni, ED Scribe. The patient was seen in Room 8 and the patient's care was started at 6:08 PM.    Patient ID: Barbara Giles, female    DOB: Jan 04, 1981, 34 y.o.   MRN: [id number] Chief Complaint  Patient presents with  . Cough    x1 week clear mucous   . Fatigue     Cough Associated symptoms include chills and a fever. Pertinent negatives include no headaches, rash, sore throat or shortness of breath.   HPI Comments: Barbara Giles is a 34 y.o. female with a hx of bronchitis who presents to the Emergency Department complaining of a gradually worsening dry cough onset. She states that she had rhinorrhea 1 week ago which has since resolved. She reports associated fever and chills. She denies sore throat, congestion, and CP. She states that she has been trying pearls with no relief and she denies trying any other medication. She reports a sick contact at home with her daughter with similar symptoms and a nephew with streph throat. She states that she is not too fond of liquid medications.   Past Medical History  Diagnosis Date  . Bronchitis   . Obesity    Current Outpatient Prescriptions on File Prior to Visit  Medication Sig Dispense Refill  . naproxen sodium (ANAPROX) 220 MG tablet Take 220 mg by mouth 2 (two) times daily as needed (for pain).     No current facility-administered medications on file prior to visit.    No Known Allergies    Review of Systems  Constitutional: Positive for fever and chills. Negative for diaphoresis and appetite change.  HENT: Negative for congestion, sore throat and trouble swallowing.   Eyes: Negative for pain and itching.  Respiratory: Positive for cough. Negative for shortness of breath.   Gastrointestinal: Negative for nausea, vomiting, abdominal pain and diarrhea.  Genitourinary: Negative for dysuria and frequency.  Musculoskeletal: Negative for back pain.  Skin:  Negative for rash.  Neurological: Negative for headaches.  Psychiatric/Behavioral: Negative for hallucinations and confusion.       Objective:   Physical Exam  Constitutional: She is oriented to person, place, and time. She appears well-developed and well-nourished. No distress.  HENT:  Head: Normocephalic and atraumatic.  Mouth/Throat: Oropharynx is clear and moist.  Mid ear fusion bilaterally  Nasal mucosa is pink and healthy Mild erythema and clear post nasal drip  Eyes: Conjunctivae and EOM are normal.  Neck: No tracheal deviation present.  Normal thyroid.   Cardiovascular: Normal rate, regular rhythm, S2 normal and normal heart sounds.   Pulmonary/Chest: Effort normal. No respiratory distress.  Musculoskeletal: Normal range of motion.  Lymphadenopathy:       Head (right side): Submandibular and tonsillar adenopathy present.       Head (left side): Submandibular and tonsillar adenopathy present.    She has no cervical adenopathy.  Neurological: She is alert and oriented to person, place, and time.  Skin: Skin is warm and dry.  Psychiatric: She has a normal mood and affect. Her behavior is normal.  Nursing note and vitals reviewed.    BP 118/74 mmHg  Pulse 75  Temp(Src) 98 F (36.7 C) (Oral)  Resp 18  Ht 5' 1.5" (1.562 m)  Wt 244 lb (110.678 kg)  BMI 45.36 kg/m2  SpO2 99%  LMP 02/19/2014        Assessment & Plan:  6:14 PM- Pt advised of plan for  treatment and pt agrees.  Pt instructed that if symptoms worsened or if fever and chills worsened in 3 to 4 days, will call in a Z-pack.  Acute bronchitis, unspecified organism Pt adamant that she can't tolerate any cough syrup - all liquid meds make her vomit.  Requests cough suppressant pill and states the "tessalon and robitussin never work for her."  Pharmacy did not have below med and was to expensive for her to have them order so switched to vicodin for cough suppressent per pt's req - has worked well for her in  past.  Meds ordered this encounter  Medications  . DISCONTD: Hydrocod Polst-Chlorphen Polst 5-4 MG CP12    Sig: Take 1 tablet by mouth 2 (two) times daily as needed (cough and congestion).    Dispense:  20 each    Refill:  0    I personally performed the services described in this documentation, which was scribed in my presence. The recorded information has been reviewed and considered, and addended by me as needed.  Norberto Sorenson, MD MPH

## 2014-03-11 NOTE — Telephone Encounter (Signed)
Please advise if pt was suppose to get an abx during this OV for bronchitis. OV note is not available.

## 2014-03-11 NOTE — Telephone Encounter (Signed)
My OV note stated that I would call in zpack for pt if her sxs persisted after 3-4d or worsening so sent in to [facility] at [facility]. If her sxs persist, should come back in for f/u OV and eval.

## 2014-03-11 NOTE — Telephone Encounter (Signed)
Pt's mother Larita Fife called in, let her know that the rx for hydrocodone is in box, Larita Fife Would like to hear from a nurse or Dr. Clelia Croft about having an antibiotic sent in. States pt  is still coughing uncontrollably. Please advise

## 2014-03-11 NOTE — Telephone Encounter (Signed)
Pt came in to exchange rx- advised abx sent to the pharmacy.

## 2014-04-26 ENCOUNTER — Other Ambulatory Visit: Payer: Self-pay

## 2014-04-26 ENCOUNTER — Ambulatory Visit (INDEPENDENT_AMBULATORY_CARE_PROVIDER_SITE_OTHER): Payer: Managed Care, Other (non HMO) | Admitting: Family Medicine

## 2014-04-26 ENCOUNTER — Encounter: Payer: Self-pay | Admitting: Family Medicine

## 2014-04-26 VITALS — BP 143/85 | HR 66 | Temp 97.6°F | Resp 16 | Ht 61.0 in | Wt 242.0 lb

## 2014-04-26 DIAGNOSIS — Z1389 Encounter for screening for other disorder: Secondary | ICD-10-CM

## 2014-04-26 DIAGNOSIS — Z0000 Encounter for general adult medical examination without abnormal findings: Secondary | ICD-10-CM

## 2014-04-26 DIAGNOSIS — Z853 Personal history of malignant neoplasm of breast: Secondary | ICD-10-CM

## 2014-04-26 DIAGNOSIS — Z1329 Encounter for screening for other suspected endocrine disorder: Secondary | ICD-10-CM

## 2014-04-26 DIAGNOSIS — Z1231 Encounter for screening mammogram for malignant neoplasm of breast: Secondary | ICD-10-CM

## 2014-04-26 DIAGNOSIS — Z1239 Encounter for other screening for malignant neoplasm of breast: Secondary | ICD-10-CM

## 2014-04-26 DIAGNOSIS — Z1383 Encounter for screening for respiratory disorder NEC: Secondary | ICD-10-CM

## 2014-04-26 DIAGNOSIS — Z136 Encounter for screening for cardiovascular disorders: Secondary | ICD-10-CM

## 2014-04-26 DIAGNOSIS — R030 Elevated blood-pressure reading, without diagnosis of hypertension: Secondary | ICD-10-CM

## 2014-04-26 DIAGNOSIS — E559 Vitamin D deficiency, unspecified: Secondary | ICD-10-CM

## 2014-04-26 DIAGNOSIS — Z803 Family history of malignant neoplasm of breast: Secondary | ICD-10-CM

## 2014-04-26 DIAGNOSIS — Z130 Encounter for screening for diseases of the blood and blood-forming organs and certain disorders involving the immune mechanism: Secondary | ICD-10-CM

## 2014-04-26 DIAGNOSIS — IMO0001 Reserved for inherently not codable concepts without codable children: Secondary | ICD-10-CM

## 2014-04-26 LAB — POCT UA - MICROSCOPIC ONLY
CASTS, UR, LPF, POC: NEGATIVE
CRYSTALS, UR, HPF, POC: NEGATIVE
EPITHELIAL CELLS, URINE PER MICROSCOPY: NEGATIVE
MUCUS UA: NEGATIVE
WBC, Ur, HPF, POC: NEGATIVE
YEAST UA: NEGATIVE

## 2014-04-26 LAB — POCT URINALYSIS DIPSTICK
Bilirubin, UA: NEGATIVE
GLUCOSE UA: NEGATIVE
Ketones, UA: NEGATIVE
Leukocytes, UA: NEGATIVE
Nitrite, UA: NEGATIVE
Protein, UA: NEGATIVE
SPEC GRAV UA: 1.025
Urobilinogen, UA: 0.2
pH, UA: 6.5

## 2014-04-26 LAB — COMPREHENSIVE METABOLIC PANEL
ALT: 23 U/L (ref 0–35)
AST: 15 U/L (ref 0–37)
Albumin: 3.9 g/dL (ref 3.5–5.2)
Alkaline Phosphatase: 61 U/L (ref 39–117)
BUN: 10 mg/dL (ref 6–23)
CALCIUM: 8.6 mg/dL (ref 8.4–10.5)
CHLORIDE: 103 meq/L (ref 96–112)
CO2: 24 mEq/L (ref 19–32)
Creat: 0.55 mg/dL (ref 0.50–1.10)
GLUCOSE: 79 mg/dL (ref 70–99)
Potassium: 4.4 mEq/L (ref 3.5–5.3)
SODIUM: 137 meq/L (ref 135–145)
Total Bilirubin: 0.4 mg/dL (ref 0.2–1.2)
Total Protein: 7.3 g/dL (ref 6.0–8.3)

## 2014-04-26 LAB — CBC
HCT: 38.8 % (ref 36.0–46.0)
Hemoglobin: 12.6 g/dL (ref 12.0–15.0)
MCH: 27.8 pg (ref 26.0–34.0)
MCHC: 32.5 g/dL (ref 30.0–36.0)
MCV: 85.5 fL (ref 78.0–100.0)
MPV: 8.7 fL (ref 8.6–12.4)
Platelets: 316 10*3/uL (ref 150–400)
RBC: 4.54 MIL/uL (ref 3.87–5.11)
RDW: 13.7 % (ref 11.5–15.5)
WBC: 7.6 10*3/uL (ref 4.0–10.5)

## 2014-04-26 LAB — LIPID PANEL
CHOL/HDL RATIO: 4.8 ratio
Cholesterol: 171 mg/dL (ref 0–200)
HDL: 36 mg/dL — AB (ref >39)
LDL CALC: 114 mg/dL — AB (ref 0–99)
Triglycerides: 106 mg/dL (ref <150)
VLDL: 21 mg/dL (ref 0–40)

## 2014-04-26 LAB — TSH: TSH: 1.092 u[IU]/mL (ref 0.350–4.500)

## 2014-04-26 NOTE — Progress Notes (Signed)
Subjective:    Patient ID: Barbara Giles, female    DOB: 04/30/1980, 34 y.o.   MRN: [id number] This chart was scribed for Sherren MochaEva N Halton Neas, MD by Leona CarryG. Clay Sherrill, ED Scribe. The patient was seen in room 25. The patient's care was started at 9:07 AM.   Chief Complaint  Patient presents with  . Annual Exam    HPI HPI Comments: Barbara Giles is a 34 y.o. female who presents today for an annual physical exam.  She states that she has recently started to exercise regularly.  She reports that she is trying to eat a healthier diet, but denies any specific dietary changes. Patient reports that she does not take any vitamins or drink milk regularly.  She reports normal menstrual periods and denies any abnormal vaginal discharge or bleeding.  She states that she is not sexually active.  She reports that her tetanus is up to date. Patient had a flu shot in October 2015.   Patient has a family history of MG type 2 diabetes, and manic/depressive disorder (father) and IBS (mother).  She reports that her sister had breast cancer two years ago at age 36.    Review of Systems  HENT: Positive for sinus pressure and sneezing.   Genitourinary: Negative for vaginal bleeding and vaginal discharge.  All other systems reviewed and are negative.  Past Medical History  Diagnosis Date  . Bronchitis   . Obesity    Current Outpatient Prescriptions on File Prior to Visit  Medication Sig Dispense Refill  . naproxen sodium (ANAPROX) 220 MG tablet Take 220 mg by mouth 2 (two) times daily as needed (for pain).     No current facility-administered medications on file prior to visit.   No Known Allergies  Past Surgical History  Procedure Laterality Date  . Tonsillectomy     Family History  Problem Relation Age of Onset  . Hypertension Mother   . Hyperlipidemia Mother   . Depression Father   . Myasthenia gravis Father   . Cancer Father     pancreatic cancer  . Diabetes Father   . Hyperlipidemia Father    . Mental illness Father   . Cancer Sister     breast cancer  . Hyperlipidemia Sister   . Cancer Paternal Aunt     breast cancer  . Mental illness Paternal Grandmother   . Hyperlipidemia Paternal Grandfather    History   Social History  . Marital Status: Single    Spouse Name: N/A  . Number of Children: N/A  . Years of Education: N/A   Social History Main Topics  . Smoking status: Never Smoker   . Smokeless tobacco: Never Used  . Alcohol Use: Yes     Comment: rarely  . Drug Use: No  . Sexual Activity: Yes    Birth Control/ Protection: None   Other Topics Concern  . None   Social History Narrative    BP 143/85 mmHg  Pulse 66  Temp(Src) 97.6 F (36.4 C)  Resp 16  Ht 5\' 1"  (1.549 m)  Wt 242 lb (109.77 kg)  BMI 45.75 kg/m2  SpO2 100%  LMP 04/25/2014      Objective:   Physical Exam  Constitutional: She is oriented to person, place, and time. She appears well-developed and well-nourished. No distress.  HENT:  Head: Normocephalic and atraumatic.  Right Ear: Tympanic membrane, external ear and ear canal normal.  Left Ear: Tympanic membrane, external ear and ear canal normal.  Nose: Nose normal. No mucosal edema or rhinorrhea.  Mouth/Throat: Uvula is midline, oropharynx is clear and moist and mucous membranes are normal. No posterior oropharyngeal erythema.  Eyes: Conjunctivae and EOM are normal. Pupils are equal, round, and reactive to light. Right eye exhibits no discharge. Left eye exhibits no discharge. No scleral icterus.  Neck: Normal range of motion. Neck supple. No tracheal deviation present. No thyromegaly present.  Cardiovascular: Normal rate, regular rhythm, normal heart sounds and intact distal pulses.   Pulmonary/Chest: Effort normal and breath sounds normal. No respiratory distress.  Abdominal: Soft. Bowel sounds are normal. There is no tenderness.  Musculoskeletal: Normal range of motion. She exhibits no edema.  Lymphadenopathy:    She has no  cervical adenopathy.  Neurological: She is alert and oriented to person, place, and time. She has normal reflexes.  Patellar DTR's 2+.  Skin: Skin is warm and dry. She is not diaphoretic. No erythema.  Psychiatric: She has a normal mood and affect. Her behavior is normal.  Nursing note and vitals reviewed.         Assessment & Plan:   Preventative health care - reviewed increasing calcium intake, try to increase gym aerobic exercise from 2-3 to 4-5x/wk. Will RTC in 1-2 wks for pap only since on menses today - fairly heavy - recheck UA at f/u as well.\  Family history of breast cancer in sister - Plan: MM Digital Screening - sister had lumpectomy at 22 - fortunately did not require any chemo or rad - pt has never had a mammogram but wants to start now.  Screening for breast cancer - Plan: MM Digital Screening  Screening for deficiency anemia - Plan: Vit D  25 hydroxy (rtn osteoporosis monitoring), CBC  Screening for cardiovascular, respiratory, and genitourinary diseases - Plan: POCT urinalysis dipstick, POCT UA - Microscopic Only, Comprehensive metabolic panel, Lipid panel  Screening for hypothyroidism - Plan: TSH  Elevated blood pressure - was fine at last visit - check outside of office.  Vit D def - start high dose replacement x 6 mos then start 1000-2000u/d - recheck level in 9-12 mos. I personally performed the services described in this documentation, which was scribed in my presence. The recorded information has been reviewed and considered, and addended by me as needed.  Norberto Sorenson, MD MPH   Results for orders placed or performed in visit on 04/26/14  Comprehensive metabolic panel  Result Value Ref Range   Sodium 137 135 - 145 mEq/L   Potassium 4.4 3.5 - 5.3 mEq/L   Chloride 103 96 - 112 mEq/L   CO2 24 19 - 32 mEq/L   Glucose, Bld 79 70 - 99 mg/dL   BUN 10 6 - 23 mg/dL   Creat 9.60 4.54 - 0.98 mg/dL   Total Bilirubin 0.4 0.2 - 1.2 mg/dL   Alkaline Phosphatase 61 39 -  117 U/L   AST 15 0 - 37 U/L   ALT 23 0 - 35 U/L   Total Protein 7.3 6.0 - 8.3 g/dL   Albumin 3.9 3.5 - 5.2 g/dL   Calcium 8.6 8.4 - 11.9 mg/dL  Lipid panel  Result Value Ref Range   Cholesterol 171 0 - 200 mg/dL   Triglycerides 147 <829 mg/dL   HDL 36 (L) >56 mg/dL   Total CHOL/HDL Ratio 4.8 Ratio   VLDL 21 0 - 40 mg/dL   LDL Cholesterol 213 (H) 0 - 99 mg/dL  TSH  Result Value Ref Range   TSH  1.092 0.350 - 4.500 uIU/mL  Vit D  25 hydroxy (rtn osteoporosis monitoring)  Result Value Ref Range   Vit D, 25-Hydroxy 11 (L) 30 - 100 ng/mL  CBC  Result Value Ref Range   WBC 7.6 4.0 - 10.5 K/uL   RBC 4.54 3.87 - 5.11 MIL/uL   Hemoglobin 12.6 12.0 - 15.0 g/dL   HCT 16.1 09.6 - 04.5 %   MCV 85.5 78.0 - 100.0 fL   MCH 27.8 26.0 - 34.0 pg   MCHC 32.5 30.0 - 36.0 g/dL   RDW 40.9 81.1 - 91.4 %   Platelets 316 150 - 400 K/uL   MPV 8.7 8.6 - 12.4 fL  POCT urinalysis dipstick  Result Value Ref Range   Color, UA yellow    Clarity, UA clear    Glucose, UA neg    Bilirubin, UA neg    Ketones, UA neg    Spec Grav, UA 1.025    Blood, UA mod    pH, UA 6.5    Protein, UA neg    Urobilinogen, UA 0.2    Nitrite, UA neg    Leukocytes, UA Negative   POCT UA - Microscopic Only  Result Value Ref Range   WBC, Ur, HPF, POC neg    RBC, urine, microscopic 3-4    Bacteria, U Microscopic trace    Mucus, UA neg    Epithelial cells, urine per micros neg    Crystals, Ur, HPF, POC neg    Casts, Ur, LPF, POC neg    Yeast, UA neg

## 2014-04-27 ENCOUNTER — Encounter: Payer: Self-pay | Admitting: Family Medicine

## 2014-04-27 DIAGNOSIS — E559 Vitamin D deficiency, unspecified: Secondary | ICD-10-CM | POA: Insufficient documentation

## 2014-04-27 LAB — VITAMIN D 25 HYDROXY (VIT D DEFICIENCY, FRACTURES): Vit D, 25-Hydroxy: 11 ng/mL — ABNORMAL LOW (ref 30–100)

## 2014-04-27 MED ORDER — ERGOCALCIFEROL 1.25 MG (50000 UT) PO CAPS: 50000.0000 [IU] | ORAL_CAPSULE | ORAL | Status: DC

## 2014-05-03 ENCOUNTER — Encounter: Payer: Self-pay | Admitting: Family Medicine

## 2014-05-03 ENCOUNTER — Ambulatory Visit (INDEPENDENT_AMBULATORY_CARE_PROVIDER_SITE_OTHER): Payer: Managed Care, Other (non HMO) | Admitting: Family Medicine

## 2014-05-03 VITALS — BP 154/81 | HR 65 | Temp 98.1°F | Resp 16 | Ht 61.5 in | Wt 241.0 lb

## 2014-05-03 DIAGNOSIS — Z124 Encounter for screening for malignant neoplasm of cervix: Secondary | ICD-10-CM

## 2014-05-03 DIAGNOSIS — R896 Abnormal cytological findings in specimens from other organs, systems and tissues: Secondary | ICD-10-CM | POA: Diagnosis not present

## 2014-05-03 DIAGNOSIS — IMO0002 Reserved for concepts with insufficient information to code with codable children: Secondary | ICD-10-CM

## 2014-05-03 NOTE — Progress Notes (Addendum)
Subjective:  This chart was scribed for Barbara MochaEva N Shaw, MD by Veverly FellsHatice Demirci,scribe, at [facility].  This patient was seen in room 26 and the patient's care was started at 8:31 AM.   Chief Complaint  Patient presents with  . Gynecologic Exam     Patient ID: Barbara Lobellison E Shipley, female    DOB: 10/14/1980, 34 y.o.   MRN: [id number]  HPI  HPI Comments: Barbara Giles is a 34 y.o. female who presents to [facility] for a pap smear.  Pt was seen last week for her CPE but was unable to perform pap/pelvic at that time due to heavy menstrual flow so here today for pelvic only. Patient has no history of abnormal pap smears.     Patient has a question about a varicose vein on her right leg and how she can avoid them.   She states that she will also keep track of her blood pressure.        Past Medical History  Diagnosis Date  . Bronchitis   . Obesity     Current Outpatient Prescriptions on File Prior to Visit  Medication Sig Dispense Refill  . ergocalciferol (VITAMIN D2) 50000 UNITS capsule Take 1 capsule (50,000 Units total) by mouth once a week. 12 capsule 1  . naproxen sodium (ANAPROX) 220 MG tablet Take 220 mg by mouth 2 (two) times daily as needed (for pain).     No current facility-administered medications on file prior to visit.    No Known Allergies     Review of Systems  Constitutional: Negative for fever, chills, activity change and unexpected weight change.  Respiratory: Negative for cough and shortness of breath.   Gastrointestinal: Negative for vomiting and diarrhea.  Genitourinary: Negative for dysuria, flank pain, vaginal bleeding, vaginal discharge, difficulty urinating, vaginal pain, menstrual problem, pelvic pain and dyspareunia.   BP 154/81 mmHg  Pulse 65  Temp(Src) 98.1 F (36.7 C)  Resp 16  Ht 5' 1.5" (1.562 m)  Wt 241 lb (109.317 kg)  BMI 44.80 kg/m2  SpO2 100%  LMP 04/25/2014     Objective:   Physical  Exam  Constitutional: She is oriented to person, place, and time. She appears well-developed and well-nourished. No distress.  HENT:  Head: Normocephalic and atraumatic.  Eyes: Conjunctivae and EOM are normal.  Neck: Neck supple.  Cardiovascular: Normal rate.   Pulmonary/Chest: Effort normal. No respiratory distress.  Musculoskeletal: Normal range of motion.  Neurological: She is alert and oriented to person, place, and time.  Skin: Skin is warm and dry.  Psychiatric: She has a normal mood and affect. Her behavior is normal.  Nursing note and vitals reviewed.   Results for orders placed or performed in visit on 04/26/14  Comprehensive metabolic panel  Result Value Ref Range   Sodium 137 135 - 145 mEq/L   Potassium 4.4 3.5 - 5.3 mEq/L   Chloride 103 96 - 112 mEq/L   CO2 24 19 - 32 mEq/L   Glucose, Bld 79 70 - 99 mg/dL   BUN 10 6 - 23 mg/dL   Creat 4.090.55 8.110.50 - 9.141.10 mg/dL   Total Bilirubin 0.4 0.2 - 1.2 mg/dL   Alkaline Phosphatase 61 39 - 117 U/L   AST 15 0 - 37 U/L   ALT 23 0 - 35 U/L   Total Protein 7.3 6.0 - 8.3 g/dL   Albumin 3.9 3.5 - 5.2 g/dL   Calcium 8.6 8.4 -  10.5 mg/dL  Lipid panel  Result Value Ref Range   Cholesterol 171 0 - 200 mg/dL   Triglycerides 161 <096 mg/dL   HDL 36 (L) >04 mg/dL   Total CHOL/HDL Ratio 4.8 Ratio   VLDL 21 0 - 40 mg/dL   LDL Cholesterol 540 (H) 0 - 99 mg/dL  TSH  Result Value Ref Range   TSH 1.092 0.350 - 4.500 uIU/mL  Vit D  25 hydroxy (rtn osteoporosis monitoring)  Result Value Ref Range   Vit D, 25-Hydroxy 11 (L) 30 - 100 ng/mL  CBC  Result Value Ref Range   WBC 7.6 4.0 - 10.5 K/uL   RBC 4.54 3.87 - 5.11 MIL/uL   Hemoglobin 12.6 12.0 - 15.0 g/dL   HCT 98.1 19.1 - 47.8 %   MCV 85.5 78.0 - 100.0 fL   MCH 27.8 26.0 - 34.0 pg   MCHC 32.5 30.0 - 36.0 g/dL   RDW 29.5 62.1 - 30.8 %   Platelets 316 150 - 400 K/uL   MPV 8.7 8.6 - 12.4 fL  POCT urinalysis dipstick  Result Value Ref Range   Color, UA yellow    Clarity, UA clear      Glucose, UA neg    Bilirubin, UA neg    Ketones, UA neg    Spec Grav, UA 1.025    Blood, UA mod    pH, UA 6.5    Protein, UA neg    Urobilinogen, UA 0.2    Nitrite, UA neg    Leukocytes, UA Negative   POCT UA - Microscopic Only  Result Value Ref Range   WBC, Ur, HPF, POC neg    RBC, urine, microscopic 3-4    Bacteria, U Microscopic trace    Mucus, UA neg    Epithelial cells, urine per micros neg    Crystals, Ur, HPF, POC neg    Casts, Ur, LPF, POC neg    Yeast, UA neg          Assessment & Plan:   Avoid salt Compression socks for varicose veins.  Keep track of blood pressure   Screening for cervical cancer - Plan: Pap IG, CT/NG w/ reflex HPV when ASC-U   I personally performed the services described in this documentation, which was scribed in my presence. The recorded information has been reviewed and considered, and addended by me as needed.  Norberto Sorenson, MD MPH

## 2014-05-07 LAB — PAP IG, CT-NG, RFX HPV ASCU
Chlamydia Probe Amp: NEGATIVE
GC Probe Amp: NEGATIVE

## 2014-05-09 LAB — HUMAN PAPILLOMAVIRUS, HIGH RISK: HPV DNA High Risk: NOT DETECTED

## 2014-05-10 ENCOUNTER — Ambulatory Visit: Payer: 59

## 2014-05-12 ENCOUNTER — Encounter: Payer: Self-pay | Admitting: Family Medicine

## 2014-05-13 ENCOUNTER — Encounter: Payer: Self-pay | Admitting: Family Medicine

## 2014-05-17 ENCOUNTER — Telehealth: Payer: Self-pay

## 2014-05-17 ENCOUNTER — Ambulatory Visit
Admission: RE | Admit: 2014-05-17 | Discharge: 2014-05-17 | Disposition: A | Discharge status: Home / Self Care | Payer: Managed Care, Other (non HMO) | Source: Ambulatory Visit | Attending: Family Medicine | Admitting: Family Medicine

## 2014-05-17 DIAGNOSIS — Z803 Family history of malignant neoplasm of breast: Secondary | ICD-10-CM

## 2014-05-17 DIAGNOSIS — Z1239 Encounter for other screening for malignant neoplasm of breast: Secondary | ICD-10-CM

## 2014-05-17 NOTE — Telephone Encounter (Signed)
Call [phone]

## 2014-05-17 NOTE — Telephone Encounter (Signed)
Mom called again wanting to get results on her pap-smear and advice on her blood pressures being high. She states her bottom numbers are in the 90's. Mom is really worried and wants to know something asap. Please advise.

## 2014-05-17 NOTE — Telephone Encounter (Signed)
Pt would like to discuss her pap smear results. Pt also wanted to let Dr. Clelia CroftShaw know that her BP is still high.

## 2014-05-17 NOTE — Telephone Encounter (Signed)
Sorry, correction call  [phone]

## 2014-05-19 ENCOUNTER — Ambulatory Visit (INDEPENDENT_AMBULATORY_CARE_PROVIDER_SITE_OTHER): Payer: Managed Care, Other (non HMO) | Admitting: Family Medicine

## 2014-05-19 VITALS — BP 130/84 | HR 83 | Temp 97.8°F | Resp 16 | Ht 61.5 in | Wt 241.0 lb

## 2014-05-19 DIAGNOSIS — J0101 Acute recurrent maxillary sinusitis: Secondary | ICD-10-CM | POA: Diagnosis not present

## 2014-05-19 DIAGNOSIS — R05 Cough: Secondary | ICD-10-CM | POA: Diagnosis not present

## 2014-05-19 DIAGNOSIS — R059 Cough, unspecified: Secondary | ICD-10-CM

## 2014-05-19 MED ORDER — HYDROCODONE-HOMATROPINE 5-1.5 MG/5ML PO SYRP: 5.0000 mL | ORAL_SOLUTION | Freq: Three times a day (TID) | ORAL | Status: DC | PRN

## 2014-05-19 MED ORDER — LEVOFLOXACIN 500 MG PO TABS: 500.0000 mg | ORAL_TABLET | Freq: Every day | ORAL | Status: DC

## 2014-05-19 MED ORDER — HYDROCODONE-ACETAMINOPHEN 5-325 MG PO TABS: 1.0000 | ORAL_TABLET | Freq: Four times a day (QID) | ORAL | Status: DC | PRN

## 2014-05-19 NOTE — Addendum Note (Signed)
Addended by: Elvina SidleLAUENSTEIN, [place] on: 05/19/2014 09:57 AM   Modules accepted: Orders

## 2014-05-19 NOTE — Patient Instructions (Signed)

## 2014-05-19 NOTE — Progress Notes (Signed)
° °  This chart was scribed for Elvina SidleKurt Lauenstein, MD by Luisa DagoPriscilla Tutu, ED Scribe. This patient was seen in room 9 and the patient's care was started at 9:38 AM.  Subjective:    Patient ID: Barbara Giles, female    DOB: 06/05/1980, 34 y.o.   MRN: [id number] Chief Complaint  Patient presents with   Nasal Congestion    x 4 days   Cough    HPI Barbara Lobellison E Stice is a 34 y.o. 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 She is also complaining of an associated dry cough that started yesterday, right ear pain, and sinus pressure and pain. Pt states that she was seen a month ago for sinusitis and was prescribed a Z-pac with some relief, but she doesn't think she fully recovered from her symptoms. She denies any fever, neck pain, sore throat, visual disturbance, CP, SOB, abdominal pain, nausea, emesis, diarrhea, urinary symptoms, back pain, HA, weakness, numbness and rash as associated symptoms.    Current Outpatient Prescriptions on File Prior to Visit  Medication Sig Dispense Refill   ergocalciferol (VITAMIN D2) 50000 UNITS capsule Take 1 capsule (50,000 Units total) by mouth once a week. 12 capsule 1   naproxen sodium (ANAPROX) 220 MG tablet Take 220 mg by mouth 2 (two) times daily as needed (for pain).     No current facility-administered medications on file prior to visit.   Review of Systems  Constitutional: Negative for fever and chills.  HENT: Positive for congestion and rhinorrhea. Negative for ear pain, sinus pressure, sore throat, trouble swallowing and voice change.   Eyes: Negative for discharge.  Respiratory: Positive for cough. Negative for shortness of breath, wheezing and stridor.   Cardiovascular: Negative for chest pain.  Gastrointestinal: Negative for abdominal pain.  Genitourinary: Negative.      BP 130/84 mmHg   Pulse 83   Temp(Src) 97.8 F (36.6 C)   Resp 16   Ht 5' 1.5"  (1.562 m)   Wt 241 lb (109.317 kg)   BMI 44.80 kg/m2   SpO2 100%   LMP 04/25/2014 Objective:   Physical Exam  Vitals reviewed. General: Well-developed, well-nourished female in no acute distress; appearance consistent with age of record HENT: normocephalic; atraumatic; swollen nasal passages with mucopurulent discharge Eyes: pupils equal, round and reactive to light; extraocular muscles intact Neck: supple; no adenopathy Heart: regular rate and rhythm; no murmurs, rubs or gallops Lungs: clear to auscultation bilaterally Abdomen: soft; nondistended; nontender; no masses or hepatosplenomegaly; bowel sounds present Extremities: No deformity; full range of motion; pulses normal Neurologic: Awake, alert and oriented; motor function intact in all extremities and symmetric; no facial droop Skin: Warm and dry Psychiatric: Normal mood and affect  Assessment & Plan:   This chart was scribed in my presence and reviewed by me personally.    ICD-9-CM ICD-10-CM   1. Acute recurrent maxillary sinusitis 461.0 J01.01 levofloxacin (LEVAQUIN) 500 MG tablet     HYDROcodone-homatropine (HYCODAN) 5-1.5 MG/5ML syrup     Signed, Elvina SidleKurt Lauenstein, MD

## 2014-05-20 NOTE — Telephone Encounter (Signed)
Called number - ASCUS pap with negative HR HPV - just needs repeat co-testing in 3 years - LVM for pt. Also told pt to avoid otc decongestants with elev BP - was seen yesterday for sinusitis.  Will reassess BP after illness resolves and will send letter in the mail explaining the details of the pap smear

## 2014-05-22 NOTE — Telephone Encounter (Signed)
Pt sent mychart message w/ pap results as listed below. Was seen in OV by Dr. Milus GlazierLauenstein on 3/14 w/ BP ok at 130/84 - considering pt was ill w/ sinus cong at that time as well which will often elevate BP this is reassuring.  Cont to push water, avoid salt and cont to monitor pressures.  Call or RTC again if BPs still elev

## 2014-05-22 NOTE — Telephone Encounter (Signed)
Spoke with pt, advised message from Dr. Clelia CroftShaw. Pt agreed and will monitor blood pressures.

## 2014-05-23 DIAGNOSIS — IMO0002 Reserved for concepts with insufficient information to code with codable children: Secondary | ICD-10-CM | POA: Insufficient documentation

## 2014-05-23 NOTE — Telephone Encounter (Signed)
My chart message sent for pt to have pap/HPV cotesting in 3 years due to new abnormal ASCUS pap with neg HR HPV.

## 2014-05-29 ENCOUNTER — Telehealth: Payer: Self-pay

## 2014-05-29 NOTE — Telephone Encounter (Signed)
Patient has completed her antibiotics and is still not better.  She is still running fever also.  Can we call her in something different?   She says her copay is $45 every time and she can't afford to keep coming in.  Please call [phone]

## 2014-05-30 ENCOUNTER — Other Ambulatory Visit: Payer: Self-pay | Admitting: Family Medicine

## 2014-05-30 DIAGNOSIS — J0100 Acute maxillary sinusitis, unspecified: Secondary | ICD-10-CM

## 2014-05-30 MED ORDER — FLUNISOLIDE 25 MCG/ACT (0.025%) NA SOLN: 2.0000 | Freq: Two times a day (BID) | NASAL | Status: DC

## 2014-05-30 MED ORDER — AMOXICILLIN-POT CLAVULANATE 875-125 MG PO TABS: 1.0000 | ORAL_TABLET | Freq: Two times a day (BID) | ORAL | Status: DC

## 2014-05-30 MED ORDER — PREDNISONE 20 MG PO TABS
40.0000 mg | ORAL_TABLET | Freq: Every day | ORAL | Status: DC
Start: 2014-05-30 — End: 2014-12-26

## 2014-05-30 NOTE — Telephone Encounter (Signed)
Dr. Milus GlazierLauenstein any suggestions?

## 2014-08-09 ENCOUNTER — Telehealth: Payer: Self-pay | Admitting: Family Medicine

## 2014-08-09 NOTE — Telephone Encounter (Signed)
Patient states that her BP has been extremely high. Last night it was 145/90 and this morning it was 151/101. Patient states that she also had a terrible headache last night.   [phone]

## 2014-08-09 NOTE — Telephone Encounter (Signed)
Yes, she needs to RTC.

## 2014-08-09 NOTE — Telephone Encounter (Signed)
At Dr. Alver FisherShaw's physical visit in February she advised pt to check outside blood pressures. Should pt RTC?

## 2014-08-09 NOTE — Telephone Encounter (Signed)
Spoke with pt, she would like Dr. Clelia CroftShaw to review.

## 2014-08-11 NOTE — Telephone Encounter (Signed)
Pt's mother called demanding something be called in for pt's high blood pressure. Per Dr. Alver FisherShaw's notes from OVs in February and March pt was to continue monitoring BP at home, no medication recommended for start. I explained to mother that generally we do not start pt's we do not know on new medications. She continually states "come on its just common sense". She asked if I were to prescribe something what would it be. I let her know I generally start people on HCTZ 12.5. Mom has hctz 25 mg at home - she wants to cut this in half and give to pt until Dr. Clelia CroftShaw can review and prescribe her something. I will send a message to Dr. Clelia CroftShaw.

## 2014-08-12 ENCOUNTER — Encounter: Payer: Self-pay | Admitting: Family Medicine

## 2014-08-12 MED ORDER — HYDROCHLOROTHIAZIDE 25 MG PO TABS: 25.0000 mg | ORAL_TABLET | Freq: Every day | ORAL | Status: DC

## 2014-08-12 NOTE — Addendum Note (Signed)
Addended by: Norberto SorensonSHAW, Jamera Vanloan on: 08/12/2014 11:04 AM   Modules accepted: Orders

## 2014-08-12 NOTE — Telephone Encounter (Signed)
Agree w/ Danae OrleansBush - fine to start hctz 25mg  qd - sent 30d rx to pharmacy. Needs f/u in office within 30d to review bp control and check labs to ensure no side effects, complications of med, or other etiology of HTN.

## 2014-08-12 NOTE — Telephone Encounter (Signed)
Spoke with patient and let her know that her hctz was called into the pharmacy and scheduled her a follow up appointment on 09/27/14 at 3 pm with Dr. Clelia CroftShaw.

## 2014-09-04 ENCOUNTER — Other Ambulatory Visit: Payer: Self-pay | Admitting: Family Medicine

## 2014-09-27 ENCOUNTER — Ambulatory Visit: Payer: Self-pay | Admitting: Family Medicine

## 2014-10-07 ENCOUNTER — Other Ambulatory Visit: Payer: Self-pay | Admitting: Physician Assistant

## 2014-11-11 ENCOUNTER — Other Ambulatory Visit: Payer: Self-pay | Admitting: Family Medicine

## 2014-11-21 ENCOUNTER — Ambulatory Visit: Payer: Managed Care, Other (non HMO) | Admitting: Family Medicine

## 2014-11-22 ENCOUNTER — Ambulatory Visit: Payer: Managed Care, Other (non HMO) | Admitting: Family Medicine

## 2014-12-26 ENCOUNTER — Ambulatory Visit (INDEPENDENT_AMBULATORY_CARE_PROVIDER_SITE_OTHER): Payer: Managed Care, Other (non HMO) | Admitting: Family Medicine

## 2014-12-26 ENCOUNTER — Other Ambulatory Visit: Payer: Self-pay | Admitting: Family Medicine

## 2014-12-26 ENCOUNTER — Encounter: Payer: Self-pay | Admitting: Family Medicine

## 2014-12-26 VITALS — BP 135/82 | HR 77 | Temp 98.4°F | Resp 16 | Ht 61.25 in | Wt 249.8 lb

## 2014-12-26 DIAGNOSIS — I10 Essential (primary) hypertension: Secondary | ICD-10-CM

## 2014-12-26 DIAGNOSIS — E559 Vitamin D deficiency, unspecified: Secondary | ICD-10-CM | POA: Diagnosis not present

## 2014-12-26 MED ORDER — TRIAMTERENE-HCTZ 37.5-25 MG PO TABS: 1.0000 | ORAL_TABLET | Freq: Every day | ORAL | Status: DC

## 2014-12-26 NOTE — Progress Notes (Signed)
Subjective:    Patient ID: Barbara Giles, female    DOB: 01/20/1981, 34 y.o.   MRN: [id number] Chief Complaint  Patient presents with  . Follow-up    blood pressure    HPI  Barbara Giles is a delightful 34 yo woman. At her last visit, we started in on hctz 25 as her BP in and out of the office was 140s-150s /80s-90s.  Pt reports she has done very well on it w/o side effects. She did run out of the rx about 3d prior and so has noted that her legs and hands are starting to feel a little tight and swollen.  Checked BP for a week shortly after starting hctz and pt brought log w/ her today for my review - 130s-140s/80s. Reports it has been better recently more 130s/80s.  Menses nml. Pt is abstinent with no plans to change that.  Is taking 2000u of otc vit D today.  Past Medical History  Diagnosis Date  . Bronchitis   . Obesity    Current Outpatient Prescriptions on File Prior to Visit  Medication Sig Dispense Refill  . hydrochlorothiazide (HYDRODIURIL) 25 MG tablet TAKE 1 TABLET (25 MG TOTAL) BY MOUTH DAILY.  "NO MORE REFILLS WITHOUT OV" 30 tablet 0  . ergocalciferol (VITAMIN D2) 50000 UNITS capsule Take 1 capsule (50,000 Units total) by mouth once a week. (Patient not taking: Reported on 12/26/2014) 12 capsule 1  . flunisolide (NASALIDE) 25 MCG/ACT (0.025%) SOLN Place 2 sprays into the nose 2 (two) times daily. (Patient not taking: Reported on 12/26/2014) 1 Bottle 0  . HYDROcodone-acetaminophen (NORCO) 5-325 MG per tablet Take 1 tablet by mouth every 6 (six) hours as needed. For cough (Patient not taking: Reported on 12/26/2014) 12 tablet 0  . naproxen sodium (ANAPROX) 220 MG tablet Take 220 mg by mouth 2 (two) times daily as needed (for pain).    . predniSONE (DELTASONE) 20 MG tablet Take 2 tablets (40 mg total) by mouth daily. (Patient not taking: Reported on 12/26/2014) 10 tablet 0   No current facility-administered medications on file prior to visit.   No Known Allergies     Review  of Systems  Constitutional: Positive for fatigue. Negative for fever, chills, diaphoresis and appetite change.  Eyes: Negative for visual disturbance.  Respiratory: Negative for cough and shortness of breath.   Cardiovascular: Positive for leg swelling. Negative for chest pain and palpitations.  Endocrine: Negative for polyuria.  Genitourinary: Negative for frequency, decreased urine volume and enuresis.  Neurological: Negative for dizziness, syncope, light-headedness and headaches.  Hematological: Does not bruise/bleed easily.       Objective:  BP 135/82 mmHg  Pulse 77  Temp(Src) 98.4 F (36.9 C) (Oral)  Resp 16  Ht 5' 1.25" (1.556 m)  Wt 249 lb 12.8 oz (113.309 kg)  BMI 46.80 kg/m2  LMP 11/29/2014  Physical Exam  Constitutional: She is oriented to person, place, and time. She appears well-developed and well-nourished. No distress.  HENT:  Head: Normocephalic and atraumatic.  Right Ear: External ear normal.  Left Ear: External ear normal.  Eyes: Conjunctivae are normal. No scleral icterus.  Neck: Normal range of motion. Neck supple. No thyromegaly present.  Cardiovascular: Normal rate, regular rhythm, normal heart sounds and intact distal pulses.   Pulmonary/Chest: Effort normal and breath sounds normal. No respiratory distress.  Musculoskeletal: She exhibits no edema.  Lymphadenopathy:    She has no cervical adenopathy.  Neurological: She is alert and oriented to person, place, and  time.  Skin: Skin is warm and dry. She is not diaphoretic. No erythema.  Psychiatric: She has a normal mood and affect. Her behavior is normal.   Results for orders placed or performed in visit on 12/26/14  POCT urinalysis dipstick  Result Value Ref Range   Color, UA yellow yellow   Clarity, UA cloudy (A) clear   Glucose, UA negative negative   Bilirubin, UA negative negative   Ketones, POC UA negative negative   Spec Grav, UA >=1.030    Blood, UA trace-intact (A) negative   pH, UA 5.0      Protein Ur, POC negative negative   Urobilinogen, UA 0.2    Nitrite, UA Negative Negative   Leukocytes, UA small (1+) (A) Negative       Assessment & Plan:   1. Essential hypertension - BP borderline controlled on hctz 25 but improved at 130s/80s - will step up trx to maxzide - come in in 1 mo for lab only bmp to ensure tolerating it.  Check BP outside office and call with average readings in 1 mo - will then send in 1 yr rx.   Could cons liiniopril if needed but reviewed teratogenic danger of unplanned preg on this - pt is abstinent  2. Vitamin D deficiency - pt completed high dose and is now on 2000u qd otc.  3.      ASCUS pap with neg HR HPV 04/2014 - no prior h/o abnml so ok to wait 3 yrs before repeat pap and HPV but ok to repeat cytology only after 04/2015 if pt prefers  F/u for CPE after 04/27/2015  Orders Placed This Encounter  Procedures  . Vit D  25 hydroxy (rtn osteoporosis monitoring)  . POCT urinalysis dipstick    Meds ordered this encounter  Medications  . triamterene-hydrochlorothiazide (MAXZIDE-25) 37.5-25 MG tablet    Sig: Take 1 tablet by mouth daily.    Dispense:  30 tablet    Refill:  3    Norberto Sorenson, MD MPH

## 2014-12-28 LAB — VITAMIN D 25 HYDROXY (VIT D DEFICIENCY, FRACTURES): VIT D 25 HYDROXY: 19 ng/mL — AB (ref 30–100)

## 2014-12-28 MED ORDER — ERGOCALCIFEROL 1.25 MG (50000 UT) PO CAPS: 50000.0000 [IU] | ORAL_CAPSULE | ORAL | Status: DC

## 2014-12-28 NOTE — Addendum Note (Signed)
Addended by: Norberto SorensonSHAW, Vaudie Engebretsen on: 12/28/2014 03:16 PM   Modules accepted: Orders

## 2015-01-01 ENCOUNTER — Telehealth: Payer: Self-pay

## 2015-01-01 NOTE — Telephone Encounter (Signed)
Patient states she received a message through her mychart with results from a urine specimen. Per patient she was seen on at our office by Dr Clelia Croftshaw on 12/26/14 but never gave urine sample. Patient request to speak with a clinical person and her call back number is [phone]

## 2015-01-02 NOTE — Telephone Encounter (Signed)
Noted  

## 2015-01-02 NOTE — Telephone Encounter (Signed)
Spoke to pt regarding urine specimen. Pt is positive she did not leave a urine specimen during her last visit. She remembers a urine specimen being on the counter, but she said it was in the room when she entered it and it was from a different patient.   I spoke to [place] and she is going to get the urinalysis dip removed from pt's chart.

## 2015-01-02 NOTE — Telephone Encounter (Signed)
Pt notified that urine specimen removed and also the charge.

## 2015-01-02 NOTE — Progress Notes (Signed)
Order(s) created erroneously. Erroneous order ID: [id number]  Order moved by: Ian MalkinEVERHART, Arliss Frisina F  Order move date/time: 01/02/2015 1:43 PM  Source Patient: [id number]  Source Contact: 12/26/2014  Destination Patient: [id number]  Destination Contact: 05/23/2012

## 2015-01-07 NOTE — Progress Notes (Signed)
Staci,  This one is for you.

## 2015-01-28 ENCOUNTER — Other Ambulatory Visit: Payer: Self-pay | Admitting: Physician Assistant

## 2015-02-11 ENCOUNTER — Emergency Department (HOSPITAL_COMMUNITY): Payer: Managed Care, Other (non HMO)

## 2015-02-11 ENCOUNTER — Emergency Department (HOSPITAL_COMMUNITY)
Admission: EM | Admit: 2015-02-11 | Discharge: 2015-02-11 | Disposition: A | Discharge status: Home / Self Care | Payer: Managed Care, Other (non HMO) | Attending: Emergency Medicine | Admitting: Emergency Medicine

## 2015-02-11 ENCOUNTER — Encounter (HOSPITAL_COMMUNITY): Payer: Self-pay

## 2015-02-11 DIAGNOSIS — I10 Essential (primary) hypertension: Secondary | ICD-10-CM | POA: Insufficient documentation

## 2015-02-11 DIAGNOSIS — E669 Obesity, unspecified: Secondary | ICD-10-CM | POA: Diagnosis not present

## 2015-02-11 DIAGNOSIS — R0789 Other chest pain: Secondary | ICD-10-CM | POA: Insufficient documentation

## 2015-02-11 DIAGNOSIS — R079 Chest pain, unspecified: Secondary | ICD-10-CM | POA: Diagnosis present

## 2015-02-11 HISTORY — DX: Essential (primary) hypertension: I10

## 2015-02-11 LAB — BASIC METABOLIC PANEL
ANION GAP: 9 (ref 5–15)
BUN: 8 mg/dL (ref 6–20)
CHLORIDE: 98 mmol/L — AB (ref 101–111)
CO2: 31 mmol/L (ref 22–32)
CREATININE: 0.64 mg/dL (ref 0.44–1.00)
Calcium: 9.4 mg/dL (ref 8.9–10.3)
GFR calc non Af Amer: >60 mL/min (ref >60)
Glucose, Bld: 122 mg/dL — ABNORMAL HIGH (ref 65–99)
Potassium: 3.3 mmol/L — ABNORMAL LOW (ref 3.5–5.1)
Sodium: 138 mmol/L (ref 135–145)

## 2015-02-11 LAB — CBC
HCT: 41 % (ref 36.0–46.0)
HEMOGLOBIN: 13.2 g/dL (ref 12.0–15.0)
MCH: 27.2 pg (ref 26.0–34.0)
MCHC: 32.2 g/dL (ref 30.0–36.0)
MCV: 84.4 fL (ref 78.0–100.0)
Platelets: 351 10*3/uL (ref 150–400)
RBC: 4.86 MIL/uL (ref 3.87–5.11)
RDW: 13.9 % (ref 11.5–15.5)
WBC: 11.4 10*3/uL — ABNORMAL HIGH (ref 4.0–10.5)

## 2015-02-11 LAB — D-DIMER, QUANTITATIVE (NOT AT ARMC)

## 2015-02-11 LAB — I-STAT TROPONIN, ED
Troponin i, poc: 0 ng/mL (ref 0.00–0.08)
Troponin i, poc: 0 ng/mL (ref 0.00–0.08)

## 2015-02-11 LAB — HCG, SERUM, QUALITATIVE: Preg, Serum: NEGATIVE

## 2015-02-11 MED ORDER — KETOROLAC TROMETHAMINE 30 MG/ML IJ SOLN
30.0000 mg | Freq: Once | INTRAMUSCULAR | Status: AC
  Administered 2015-02-11: 30 mg via INTRAVENOUS
  Filled 2015-02-11: qty 1

## 2015-02-11 NOTE — ED Notes (Signed)
Pt arrives POV with c/o sudden onset CP at right chest radiating into back. Denies n/v , SOB or sweating.

## 2015-02-11 NOTE — Discharge Instructions (Signed)
You were evaluated in the emergency department for chest pain. Your workup showed no acute findings for your chest pain. You should take 800 mg of ibuprofen every 6 hours for the next couple of days for musculoskeletal pain. Follow up with her primary care physician in the next 1-2 days for reevaluation. Return to the emergency department for any worsening of symptoms.

## 2015-02-11 NOTE — ED Notes (Signed)
Dr. Mumma at bedside.

## 2015-02-11 NOTE — ED Provider Notes (Signed)
CSN: [id number]     Arrival date & time 02/11/15  1410 History   First MD Initiated Contact with Patient 02/11/15 1808     Chief Complaint  Patient presents with  . Chest Pain     (Consider location/radiation/quality/duration/timing/severity/associated sxs/prior Treatment) HPI  Patient is a 34 year old female with past medical history significant for hypertension, who presents to the emergency department with right-sided chest pain. Reports right-sided chest pain that started at approximately 11 AM. Sharp, constant, radiates to the right upper back. Worsened with movement. Improved with rest. Has not taken anything for the pain. No association with shortness of breath, nausea, vomiting, diaphoresis, lightheadedness, syncope, abdominal pain, fevers, chills, cough, congestion. Denies prior history of DVT/PE. No recent surgeries. No prolonged immobilization. Denies family history of heart attack at a young age.  Past Medical History  Diagnosis Date  . Bronchitis   . Obesity   . Hypertension    Past Surgical History  Procedure Laterality Date  . Tonsillectomy     Family History  Problem Relation Age of Onset  . Hypertension Mother   . Hyperlipidemia Mother   . Depression Father   . Myasthenia gravis Father   . Cancer Father     pancreatic cancer  . Diabetes Father   . Hyperlipidemia Father   . Mental illness Father   . Cancer Sister     breast cancer  . Hyperlipidemia Sister   . Cancer Paternal Aunt     breast cancer  . Mental illness Paternal Grandmother   . Hyperlipidemia Paternal Grandfather    Social History  Substance Use Topics  . Smoking status: Never Smoker   . Smokeless tobacco: Never Used  . Alcohol Use: Yes     Comment: rarely   OB History    Gravida Para Term Preterm AB TAB SAB Ectopic Multiple Living   1 1             Review of Systems  Constitutional: Negative for fever and appetite change.  HENT: Negative for congestion.   Eyes: Negative for visual  disturbance.  Respiratory: Negative for cough, chest tightness, shortness of breath and wheezing.   Cardiovascular: Positive for chest pain. Negative for palpitations and leg swelling.  Gastrointestinal: Negative for vomiting, abdominal pain, diarrhea and blood in stool.  Genitourinary: Negative for dysuria, hematuria and flank pain.  Musculoskeletal: Negative for back pain.  Skin: Negative for rash.  Neurological: Negative for dizziness, seizures, weakness and light-headedness.  Psychiatric/Behavioral: Negative for behavioral problems and confusion.      Allergies  Review of patient's allergies indicates no known allergies.  Home Medications   Prior to Admission medications   Medication Sig Start Date End Date Taking? Authorizing Provider  ergocalciferol (VITAMIN D2) 50000 UNITS capsule Take 1 capsule (50,000 Units total) by mouth once a week. Patient taking differently: Take 50,000 Units by mouth every Thursday.  12/28/14  Yes Sherren Mocha, MD  naproxen sodium (ANAPROX) 220 MG tablet Take 220 mg by mouth 2 (two) times daily as needed (for pain).   Yes Historical Provider, MD  triamterene-hydrochlorothiazide (MAXZIDE-25) 37.5-25 MG tablet Take 1 tablet by mouth daily. 12/26/14  Yes Sherren Mocha, MD   BP 121/88 mmHg  Pulse 100  Temp(Src) 98 F (36.7 C) (Oral)  Resp 20  Ht 5' (1.524 m)  Wt 111.131 kg  BMI 47.85 kg/m2  SpO2 98%  LMP 02/04/2015 Physical Exam  Constitutional: She is oriented to person, place, and time. She appears well-developed and  well-nourished. No distress.  HENT:  Head: Normocephalic and atraumatic.  Mouth/Throat: Oropharynx is clear and moist.  Eyes: Conjunctivae and EOM are normal. Pupils are equal, round, and reactive to light.  Neck: Normal range of motion. No JVD present. No tracheal deviation present.  Cardiovascular: Normal rate, regular rhythm, normal heart sounds and intact distal pulses.   No murmur heard. Pulmonary/Chest: Effort normal and breath  sounds normal. No respiratory distress. She has no wheezes. She exhibits no tenderness.  Abdominal: Soft. She exhibits no distension. There is no tenderness. There is no rebound and no guarding.  Musculoskeletal: Normal range of motion.  Right sided chest pain when the patient elevates right upper extremity above 90. No tenderness to palpation of the right upper extremity, no bony deformities, sensation intact, motor function intact.  Neurological: She is alert and oriented to person, place, and time.  Skin: Skin is warm. No pallor.  Psychiatric: She has a normal mood and affect.  Nursing note and vitals reviewed.   ED Course  Procedures (including critical care time) Labs Review Labs Reviewed  BASIC METABOLIC PANEL - Abnormal; Notable for the following:    Potassium 3.3 (*)    Chloride 98 (*)    Glucose, Bld 122 (*)    All other components within normal limits  CBC - Abnormal; Notable for the following:    WBC 11.4 (*)    All other components within normal limits  D-DIMER, QUANTITATIVE (NOT AT [facility])  HCG, SERUM, QUALITATIVE  I-STAT TROPOININ, ED  Rosezena Sensor, ED    Imaging Review Dg Chest 2 View  02/11/2015  CLINICAL DATA:  Chest pain EXAM: CHEST  2 VIEW COMPARISON:  None. FINDINGS: Normal heart size and mediastinal contours. No acute infiltrate or edema. No effusion or pneumothorax. No acute osseous findings. IMPRESSION: Negative chest. Electronically Signed   By: Marnee Spring M.D.   On: 02/11/2015 15:15   I have personally reviewed and evaluated these images and lab results as part of my medical decision-making.   EKG Interpretation   Date/Time:  Tuesday February 11 2015 14:29:11 EST Ventricular Rate:  115 PR Interval:  164 QRS Duration: 78 QT Interval:  314 QTC Calculation: 434 R Axis:   76 Text Interpretation:  Sinus tachycardia Septal infarct , age undetermined  T wave abnormality, consider inferior ischemia Abnormal ECG No old tracing  to compare  Confirmed by GOLDSTON  MD, SCOTT ([id number]) on 02/11/2015 5:55:56 PM      MDM   Final diagnoses:  Other chest pain    Patient is a 34 red female with past medical history significant for hypertension, who presents to the emergency department with right-sided chest pain. On arrival no acute distress, not ill appearing. Afebrile, hemodynamically stable. Exam as above, notable for lungs clear to auscultation bilaterally, RRR, intact distal pulses, benign abdominal exam, chest pain with passive range of motion of the right upper extremity greater than 90.  Differential diagnosis includes ACS, pulmonary embolism, aortic dissection, pneumonia or pneumothorax, musculoskeletal.  Given Toradol for pain.  EKG showed sinus tachycardia, rate 115, normal intervals, no chamber enlargement, nonspecific T wave abnormality of lead III and aVF, no signs of ischemia. No prior EKG to compare. Lab work unremarkable. Initial troponin 0.00. HEART score 1, will obtain delta troponin to evaluate for ACS. Low risk well's criteria, will obtain a d-dimer to evaluate for pulmonary embolism. Chest x-ray showed no acute findings. Doubt aortic dissection, no widened mediastinum, does not fit clinical picture, intact distal pulses.  Repeat troponin 0.00. D-dimer undetectable. Pregnancy test negative. Patient most likely with musculoskeletal chest pain.   Patient stable for discharge home. Discussed follow up with primary care physician for continuation of chest pain. Discussed strict return precautions to the emergency department. Patient discharged home, no questions or concerns at time of discharge.  Corena HerterShannon Janny Crute, MD 02/12/15 1108  Pricilla LovelessScott Goldston, MD 02/15/15 [phone]

## 2015-02-13 ENCOUNTER — Telehealth: Payer: Self-pay

## 2015-02-13 NOTE — Telephone Encounter (Signed)
Dr. Clelia CroftShaw   Patient was in ER Tuesday, labs were done there. BP medication is not working.  She is asking for new meds.   Karin [employer] [place]  [phone]

## 2015-02-14 NOTE — Telephone Encounter (Signed)
Well she came in because she was in pain and the ER is a high stress place to be so would never change anything based upon ER vitals. EKG reviewed.  Pt's blood pressure is still to high on the maxzide since her diastolic is in the 90s - so we are going to add on a different medicine and look for other causes.  Pt definitely needs to come in for an appointment and bring her home BP cuff with her.  If her cuff doesn't do it, she can check her pulse at home as well.  In the meantime, needs to increase diet in high K foods - K was low in the ER.   Make sure you increase your diet in potassium - high potassium foods are: Bran cereals and other bran products, Milk (skim, 1%), Yogurt, Avocados, Bananas, Dried fruits, Kiwis, Oranges, Prunes, Raisins, Baked beans, Spinach, Tomatoes, Peanut butter, Nuts, and Tofu.  Other foods that have some potassium in them but not quite as much are Cherries, Mangoes, Asparagus, Peas, Zucchini, Celery, Cantaloupes, Fresh peaches, Broccoli stalks, Peppers, Figs, Fresh pears, Kale, and Summer squashes.  Make sure you eat several of these items daily

## 2015-02-14 NOTE — Telephone Encounter (Signed)
Dr. Clelia CroftShaw  Pt. Was at work unable to reach her. Mom said she would have her call back to get those numbers as far as how her BP has been running on maxzide. But did say the bottom number the lowest was in the 90's. Mom would like you to take a look at her EKG and lab work from when she was there. Mom is really concerned that her heart rate stayed elevated around the 130 mark even while at rest at the hospital.

## 2015-02-14 NOTE — Telephone Encounter (Signed)
What has her BP been on the maxzide?  Is she still taking it?

## 2015-02-17 NOTE — Telephone Encounter (Signed)
Left message for pt to call back  °

## 2015-02-18 MED ORDER — LISINOPRIL 10 MG PO TABS: 10.0000 mg | ORAL_TABLET | Freq: Every day | ORAL | Status: DC

## 2015-02-18 NOTE — Telephone Encounter (Signed)
Yep that's fine - please see if she would like to go ahead and sched an appt. Sent in lisinopril.

## 2015-02-18 NOTE — Telephone Encounter (Signed)
Spoke with pt, advised message from Dr. Clelia CroftShaw. Pt understood. She states her blood pressure was 130-160 on the top and 80-95 on the bottom. I did not see another medication sent in. Did you mean to send in another medication? Can she follow up with you after Christmas?

## 2015-02-19 NOTE — Telephone Encounter (Signed)
Advised pt

## 2015-05-13 ENCOUNTER — Other Ambulatory Visit: Payer: Self-pay | Admitting: Family Medicine

## 2015-05-15 ENCOUNTER — Ambulatory Visit (INDEPENDENT_AMBULATORY_CARE_PROVIDER_SITE_OTHER): Payer: Managed Care, Other (non HMO) | Admitting: Family Medicine

## 2015-05-15 VITALS — BP 128/82 | HR 81 | Temp 98.0°F | Resp 16 | Ht 60.0 in | Wt 250.0 lb

## 2015-05-15 DIAGNOSIS — I10 Essential (primary) hypertension: Secondary | ICD-10-CM

## 2015-05-15 DIAGNOSIS — E559 Vitamin D deficiency, unspecified: Secondary | ICD-10-CM

## 2015-05-15 DIAGNOSIS — H538 Other visual disturbances: Secondary | ICD-10-CM

## 2015-05-15 LAB — GLUCOSE, POCT (MANUAL RESULT ENTRY): POC Glucose: 100 mg/dl — AB (ref 70–99)

## 2015-05-15 LAB — POCT CBC
Granulocyte percent: 63.8 %G (ref 37–80)
HCT, POC: 36 % — AB (ref 37.7–47.9)
Hemoglobin: 12.7 g/dL (ref 12.2–16.2)
Lymph, poc: 3.9 — AB (ref 0.6–3.4)
MCH, POC: 28.7 pg (ref 27–31.2)
MCHC: 35.3 g/dL (ref 31.8–35.4)
MCV: 81.2 fL (ref 80–97)
MID (CBC): 0.8 (ref 0–0.9)
MPV: 6.9 fL (ref 0–99.8)
PLATELET COUNT, POC: 305 10*3/uL (ref 142–424)
POC Granulocyte: 8.2 — AB (ref 2–6.9)
POC LYMPH %: 30.1 % (ref 10–50)
POC MID %: 6.1 %M (ref 0–12)
RBC: 4.43 M/uL (ref 4.04–5.48)
RDW, POC: 14 %
WBC: 12.9 10*3/uL — AB (ref 4.6–10.2)

## 2015-05-15 LAB — POCT GLYCOSYLATED HEMOGLOBIN (HGB A1C): HEMOGLOBIN A1C: 5.4

## 2015-05-15 NOTE — Patient Instructions (Addendum)
Because you received labwork today, you will receive an invoice from [facility]. Please contact [facility] at [phone] with questions or concerns regarding your invoice. Our billing staff will not be able to assist you with those questions.  You will be contacted with the lab results as soon as they are available. The fastest way to get your results is to activate your My Chart account. Instructions are located on the last page of this paperwork. If you have not heard from us regarding the results in 2 weeks, please contact this office.   Blurred Vision Having blurred vision means that you cannot see things clearly. Your vision may seem fuzzy or out of focus. Blurred vision is a very common symptom of an eye or vision problem. Blurred vision is often a gradual blur that occurs in one eye or both eyes. There are many causes of blurred vision, including cataracts, macular degeneration, and diabetic retinopathy. Blurred vision can be diagnosed based on your symptoms and a physical exam. Tell your health care provider about any other health problems you have, any recent eye injury, and any prior surgeries. You may need to see a health care provider who specializes in eye problems (ophthalmologist). Your treatment depends on what is causing your blurred vision.  HOME CARE INSTRUCTIONS  Tell your health care provider about any changes in your blurred vision.  Do not drive or operate heavy machinery if your vision is blurry.  Keep all follow-up visits as directed by your health care provider. This is important. SEEK MEDICAL CARE IF:  Your symptoms get worse.  You have new symptoms.  You have trouble seeing at night.  You have trouble seeing up close or far away.  You have trouble noticing the difference between colors. SEEK IMMEDIATE MEDICAL CARE IF:  You have severe eye pain.  You have a severe headache.  You have flashing lights in your field of vision.  You  have a sudden change in vision.  You have a sudden loss of vision.  You have vision change after an injury.  You notice drainage coming from your eyes.  You notice a rash around your eyes.   This information is not intended to replace advice given to you by your health care provider. Make sure you discuss any questions you have with your health care provider.   Document Released: 02/25/2003 Document Revised: 07/09/2014 Document Reviewed: 01/16/2014 Elsevier Interactive Patient Education [employer]2016 [employer]

## 2015-05-15 NOTE — Progress Notes (Signed)
Subjective:    Patient ID: Barbara Giles, female    DOB: 12/22/1980, 35 y.o.   MRN: [id number]  05/15/2015  Blurred Vision   HPI This 35 y.o. female presents for evaluation of blurred vision B eyes.  Television is blurry; cloudy but not cloudy.  Underwent eye exam this morning; eye exam completely normal.  Suggested migraine ocular.  Onset prior to 10:00am; dilated eyes; thorough exam today by Dr. Alden Hipp.  No headache.  No nausea, vomiting.  No photophobia.  No glasses or contacts. Diffuse blurring.  Behind eyes hurt because eyes seem to be working to correct blurred vision. Can strain/work really hard to clear vision.  No problems driving but cannot read street signs.  Vision has always been fine.  No pain with eye movement.  No tearing. No redness.  Prescribed eye drops/lubricant.   Took Benadryl last night.  Pressures were fine in eyes.    Has pain in back of head R posterior occipital region; sharp head to ear; ED visit in past.  Sharp stabbing headache that lasts seconds.   Mother is Barbara Giles   Review of Systems  Constitutional: Negative for fever, chills, diaphoresis and fatigue.  Eyes: Positive for visual disturbance. Negative for photophobia.  Respiratory: Negative for cough and shortness of breath.   Cardiovascular: Negative for chest pain, palpitations and leg swelling.  Gastrointestinal: Negative for nausea, vomiting, abdominal pain, diarrhea and constipation.  Endocrine: Negative for cold intolerance, heat intolerance, polydipsia, polyphagia and polyuria.  Neurological: Negative for dizziness, tremors, seizures, syncope, facial asymmetry, speech difficulty, weakness, light-headedness, numbness and headaches.     Past Medical History  Diagnosis Date  . Bronchitis   . Obesity   . Hypertension    Past Surgical History  Procedure Laterality Date  . Tonsillectomy     No Known Allergies Current Outpatient Prescriptions  Medication Sig Dispense Refill  .  ergocalciferol (VITAMIN D2) 50000 UNITS capsule Take 1 capsule (50,000 Units total) by mouth once a week. (Patient taking differently: Take 50,000 Units by mouth every Thursday. ) 12 capsule 1  . lisinopril (PRINIVIL,ZESTRIL) 10 MG tablet TAKE 1 TABLET (10 MG TOTAL) BY MOUTH DAILY. 30 tablet 0  . naproxen sodium (ANAPROX) 220 MG tablet Take 220 mg by mouth 2 (two) times daily as needed (for pain).     No current facility-administered medications for this visit.   Social History   Social History  . Marital Status: Single    Spouse Name: N/A  . Number of Children: N/A  . Years of Education: N/A   Occupational History  .      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 Smoking status: Never Smoker   . Smokeless tobacco: Never Used  . Alcohol Use: Yes     Comment: rarely  . Drug Use: No  . Sexual Activity: Yes    Birth Control/ Protection: None   Other Topics Concern  . Not on file   Social History Narrative   Family History  Problem Relation Age of Onset  . Hypertension Mother   . Hyperlipidemia Mother   . Depression Father   . Myasthenia gravis Father   . Cancer Father     pancreatic cancer  . Diabetes Father   . Hyperlipidemia Father   . Mental illness Father   . Cancer Sister     breast cancer  . Hyperlipidemia Sister   . Hypertension Sister   . Cancer Paternal Aunt  breast cancer  . Mental illness Paternal Grandmother   . Hyperlipidemia Paternal Grandfather        Objective:    BP 128/82 mmHg  Pulse 81  Temp(Src) 98 F (36.7 C) (Oral)  Resp 16  Ht 5' (1.524 m)  Wt 250 lb (113.399 kg)  BMI 48.82 kg/m2  SpO2 98%  LMP 05/01/2015 (Approximate) Physical Exam  Constitutional: She is oriented to person, place, and time. She appears well-developed and well-nourished. No distress.  HENT:  Head: Normocephalic and atraumatic.  Right Ear: External ear normal.  Left Ear: External ear normal.  Nose: Nose normal.  Mouth/Throat: Oropharynx is  clear and moist.  Eyes: Conjunctivae and EOM are normal. Pupils are equal, round, and reactive to light.  Neck: Normal range of motion. Neck supple. Carotid bruit is not present. No thyromegaly present.  Cardiovascular: Normal rate, regular rhythm, normal heart sounds and intact distal pulses.  Exam reveals no gallop and no friction rub.   No murmur heard. Pulmonary/Chest: Effort normal and breath sounds normal. She has no wheezes. She has no rales.  Abdominal: Soft. Bowel sounds are normal. She exhibits no distension and no mass. There is no tenderness. There is no rebound and no guarding.  Lymphadenopathy:    She has no cervical adenopathy.  Neurological: She is alert and oriented to person, place, and time. No cranial nerve deficit. She exhibits normal muscle tone. Coordination normal.  Skin: Skin is warm and dry. No rash noted. She is not diaphoretic. No erythema. No pallor.  Psychiatric: She has a normal mood and affect. Her behavior is normal. Judgment and thought content normal.   Results for orders placed or performed in visit on 05/15/15  POCT CBC  Result Value Ref Range   WBC 12.9 (A) 4.6 - 10.2 K/uL   Lymph, poc 3.9 (A) 0.6 - 3.4   POC LYMPH PERCENT 30.1 10 - 50 %L   MID (cbc) 0.8 0 - 0.9   POC MID % 6.1 0 - 12 %M   POC Granulocyte 8.2 (A) 2 - 6.9   Granulocyte percent 63.8 37 - 80 %G   RBC 4.43 4.04 - 5.48 M/uL   Hemoglobin 12.7 12.2 - 16.2 g/dL   HCT, POC 16.136.0 (A) 09.637.7 - 47.9 %   MCV 81.2 80 - 97 fL   MCH, POC 28.7 27 - 31.2 pg   MCHC 35.3 31.8 - 35.4 g/dL   RDW, POC 04.514.0 %   Platelet Count, POC 305 142 - 424 K/uL   MPV 6.9 0 - 99.8 fL  POCT glucose (manual entry)  Result Value Ref Range   POC Glucose 100 (A) 70 - 99 mg/dl  POCT glycosylated hemoglobin (Hb A1C)  Result Value Ref Range   Hemoglobin A1C 5.4        Assessment & Plan:   1. Blurred vision, bilateral   2. Vitamin D deficiency   3. Essential hypertension    -normal vision in office. -normal  neurological exam. -normal glucose.   -s/p eye exam today that was negative. -rtc to Dr. Alden HippGrote for worsening.   Orders Placed This Encounter  Procedures  . Comprehensive metabolic panel  . TSH  . VITAMIN D 25 Hydroxy (Vit-D Deficiency, Fractures)  . POCT CBC  . POCT glucose (manual entry)  . POCT glycosylated hemoglobin (Hb A1C)   No orders of the defined types were placed in this encounter.    No Follow-up on file.    Devin Foskey Paulita FujitaMartin Alis Sawchuk, M.D. [facility] [address] [place], [place]  [address] [phone] phone [phone] fax

## 2015-05-16 LAB — COMPREHENSIVE METABOLIC PANEL
ALK PHOS: 52 U/L (ref 33–115)
ALT: 25 U/L (ref 6–29)
AST: 21 U/L (ref 10–30)
Albumin: 3.9 g/dL (ref 3.6–5.1)
BUN: 8 mg/dL (ref 7–25)
CO2: 21 mmol/L (ref 20–31)
CREATININE: 0.61 mg/dL (ref 0.50–1.10)
Calcium: 9.1 mg/dL (ref 8.6–10.2)
Chloride: 102 mmol/L (ref 98–110)
GLUCOSE: 89 mg/dL (ref 65–99)
POTASSIUM: 4.3 mmol/L (ref 3.5–5.3)
SODIUM: 139 mmol/L (ref 135–146)
Total Bilirubin: 0.2 mg/dL (ref 0.2–1.2)
Total Protein: 7.1 g/dL (ref 6.1–8.1)

## 2015-05-16 LAB — TSH: TSH: 1.24 mIU/L

## 2015-05-17 LAB — VITAMIN D 25 HYDROXY (VIT D DEFICIENCY, FRACTURES): VIT D 25 HYDROXY: 30 ng/mL (ref 30–100)

## 2015-06-12 ENCOUNTER — Ambulatory Visit: Payer: Managed Care, Other (non HMO) | Admitting: Family Medicine

## 2015-06-20 ENCOUNTER — Other Ambulatory Visit: Payer: Self-pay | Admitting: Family Medicine

## 2015-07-21 ENCOUNTER — Other Ambulatory Visit: Payer: Self-pay | Admitting: Family Medicine

## 2015-08-01 ENCOUNTER — Ambulatory Visit (INDEPENDENT_AMBULATORY_CARE_PROVIDER_SITE_OTHER): Payer: Managed Care, Other (non HMO) | Admitting: Physician Assistant

## 2015-08-01 ENCOUNTER — Telehealth: Payer: Self-pay

## 2015-08-01 VITALS — BP 124/90 | HR 81 | Temp 97.4°F | Resp 18 | Ht 60.0 in | Wt 252.0 lb

## 2015-08-01 DIAGNOSIS — J069 Acute upper respiratory infection, unspecified: Secondary | ICD-10-CM

## 2015-08-01 DIAGNOSIS — B9789 Other viral agents as the cause of diseases classified elsewhere: Principal | ICD-10-CM

## 2015-08-01 MED ORDER — HYDROCODONE-HOMATROPINE 5-1.5 MG/5ML PO SYRP: 2.5000 mL | ORAL_SOLUTION | Freq: Every evening | ORAL | Status: DC | PRN

## 2015-08-01 NOTE — Patient Instructions (Addendum)
-   You have a viral upper respiratory infection, (the common cold) and it is not uncommon for symptoms to last 10 days to 2 weeks, regardless of which medications you take. Unfortunately antibiotics will not help your symptoms as they target bacteria, and you are likely suffering from a virus. Additionally, 1 in 8 people who take antibiotics suffer mild to severe side effects.    - You would benefit from high dose ibuprofen. TAKE 600-800 mg of ibuprofen every 8 hours as needed to control low grade fever, fatigue, and pain.    - I also recommend that you take Zyrtec-D 5-120 every morning or Claritin-D 10-240 for the next five to 10 days. This medication will help you with nasal congestion, post nasal drip and sneezing. You will have to purchase this directly from the pharmacist   Viral URI Medications: Please consult the pharmacist if you have questions. 600-800 mg ibuprofen every 8 hours as needed for the next five to ten days 5-120 Zyrtec-D every morning for five to 10 days OR Claritin D 10-240 every morning for five days.  Please visit the CDC's website https://www.cdc.gov/getsmart/ to learn about appropriate and inappropriate uses of antibiotics.      IF you received an x-ray today, you will receive an invoice from [facility]. Please contact [facility] at [phone] with questions or concerns regarding your invoice.   IF you received labwork today, you will receive an invoice from [facility]. Please contact [facility] at [phone] with questions or concerns regarding your invoice.   Our billing staff will not be able to assist you with questions regarding bills from these companies.  You will be contacted with the lab results as soon as they are available. The fastest way to get your results is to activate your My Chart account. Instructions are located on the last page of this paperwork. If you have not heard from us regarding the results in  2 weeks, please contact this office.      

## 2015-08-01 NOTE — Telephone Encounter (Signed)
Pt was seen today 5/26 by Deliah BostonMichael Clark for Viral URI with cough - Primary. Pt's mom called in wanting the pill version of HYDROcodone-homatropine (HYCODAN) 5-1.5 MG/5ML syrup [[id number]][[id number]]. She says her daughter doesn't tolerate the liquid version well-it makes her stomach upset. Please advise at mom [phone]

## 2015-08-01 NOTE — Progress Notes (Signed)
   08/01/2015 1:48 PM   DOB: 06/03/1980 / MRN: [id number]  SUBJECTIVE:  Barbara Giles is a 35 y.o. female presenting for chest congestion and nasal congestion that started 5 days ago.  She feels she is not getting worse or better.  Associates mild productive coughing.  She is a never smoker.  Denies fever, chills, nausea.  Has tried multiple OTC treatments without relief.   She has No Known Allergies.   She  has a past medical history of Bronchitis; Obesity; and Hypertension.    She  reports that she has never smoked. She has never used smokeless tobacco. She reports that she drinks alcohol. She reports that she does not use illicit drugs. She  reports that she currently engages in sexual activity. She reports using the following method of birth control/protection: None. The patient  has past surgical history that includes Tonsillectomy.  Her family history includes Cancer in her father, paternal aunt, and sister; Depression in her father; Diabetes in her father; Hyperlipidemia in her father, mother, paternal grandfather, and sister; Hypertension in her mother and sister; Mental illness in her father and paternal grandmother; Myasthenia gravis in her father.  Review of Systems  Constitutional: Negative for fever and chills.  Eyes: Negative for blurred vision.  Respiratory: Negative for cough and shortness of breath.   Cardiovascular: Negative for chest pain.  Gastrointestinal: Negative for nausea and abdominal pain.  Genitourinary: Negative for dysuria, urgency and frequency.  Musculoskeletal: Negative for myalgias.  Skin: Negative for rash.  Neurological: Negative for dizziness, tingling and headaches.  Psychiatric/Behavioral: Negative for depression. The patient is not nervous/anxious.     Problem list and medications reviewed and updated by myself where necessary,o and exist elsewhere in the encounter.   OBJECTIVE:  BP 124/90 mmHg  Pulse 81  Temp(Src) 97.4 F (36.3 C) (Oral)   Resp 18  Ht 5' (1.524 m)  Wt 252 lb (114.306 kg)  BMI 49.22 kg/m2  SpO2 99%  LMP 06/30/2015  Physical Exam  Constitutional: She is oriented to person, place, and time.  HENT:  Right Ear: External ear normal.  Left Ear: External ear normal.  Nose: Mucosal edema present. Right sinus exhibits no maxillary sinus tenderness and no frontal sinus tenderness. Left sinus exhibits no maxillary sinus tenderness and no frontal sinus tenderness.  Mouth/Throat: Oropharynx is clear and moist. No oropharyngeal exudate.  Eyes: Conjunctivae are normal. Pupils are equal, round, and reactive to light.  Cardiovascular: Regular rhythm and normal heart sounds.   Pulmonary/Chest: Effort normal and breath sounds normal. She has no rales.  Neurological: She is alert and oriented to person, place, and time.  Skin: Skin is warm and dry. No rash noted. She is not diaphoretic. No erythema.  Psychiatric: Her behavior is normal.    No results found for this or any previous visit (from the past 72 hour(s)).  No results found.  ASSESSMENT AND PLAN  Revonda Standardllison was seen today for cough and sinusitis.  Diagnoses and all orders for this visit:  Viral URI with cough  Other orders -     HYDROcodone-homatropine (HYCODAN) 5-1.5 MG/5ML syrup; Take 2.5-5 mLs by mouth at bedtime as needed.   The patient was advised to call or return to clinic if she does not see an improvement in symptoms or to seek the care of the closest emergency department if she worsens with the above plan.   Deliah BostonMichael Clark, MHS, PA-C [facility] 08/01/2015 1:48 PM

## 2015-08-05 NOTE — Telephone Encounter (Signed)
I do not believe there is a pill form for this medication and most likely the Hydrocodone is what is upsetting her stomach.

## 2015-08-05 NOTE — Telephone Encounter (Signed)
Chart reviewed.  Please advise patient that I did not see her; thus, I will defer to [facility] regarding ongoing symptoms and appropriate management.

## 2015-08-05 NOTE — Telephone Encounter (Signed)
LM to call back if she still needed another Rx.

## 2015-08-05 NOTE — Telephone Encounter (Signed)
Mom called back requesting an ABX. She states her daughter usually gets bronchitis when she gets a viral cold. She is coughing uncontrollably and very fatigued. Can we send in something for pt? They also need a pill for the cough because she cannot take liquid. Pt's mother wanted Dr. Katrinka BlazingSmith to review. She states Dr. Katrinka BlazingSMith knows her and her daughter is just like her. She needs ABX and cough medication that is not liquid. She is frustrated because she feels this should have been done at the visit and nobody looks back and reviews the chart.

## 2015-08-06 NOTE — Telephone Encounter (Signed)
Casimiro NeedleMichael,  Please see below.  Thanks  [employer] CoJocelyn

## 2015-08-07 NOTE — Telephone Encounter (Signed)
I don't have any findings in history or exam that warrant an taking an antibiotic.  Her vital signs and exam were perfect.  Please advise that she should try OTC dextromethorphan pills for cough.  I'd be happy to prescribe tessalon if she would like this.  If she develops fever, SOB, diaphoresis, and new DOE then advise she RTC for chest rads and blood work.  Deliah BostonMichael Clark, MS, PA-C 12:53 PM, 08/07/2015

## 2015-08-07 NOTE — Telephone Encounter (Signed)
Called Left message for pt to call back.

## 2015-08-08 ENCOUNTER — Other Ambulatory Visit: Payer: Self-pay | Admitting: Physician Assistant

## 2015-08-08 DIAGNOSIS — R059 Cough, unspecified: Secondary | ICD-10-CM

## 2015-08-08 DIAGNOSIS — R05 Cough: Secondary | ICD-10-CM

## 2015-08-08 MED ORDER — HYDROCODONE-HOMATROPINE 5-1.5 MG PO TABS: 1.0000 | ORAL_TABLET | Freq: Every day | ORAL | Status: DC

## 2015-08-08 MED ORDER — BENZONATATE 200 MG PO CAPS: 200.0000 mg | ORAL_CAPSULE | Freq: Two times a day (BID) | ORAL | Status: DC | PRN

## 2015-08-08 NOTE — Telephone Encounter (Signed)
Spoke to Lexmark InternationalLynn Giles, patient's mother, and she states that the medication she is requesting is tylenol-3 to help with the cough at night while sleeping.  She states that Dr. Katrinka BlazingSmith knows that she (mother) has this same issue.  She also would like her daughter to have the tessalon pearls.  Patient 's mother is upset that this matter is taking so long to be resolved.  Please advise.

## 2015-08-11 NOTE — Progress Notes (Signed)
Mom sent a mychart message to Dr. Katrinka BlazingSmith about this pt. FYI. I called and no answer.

## 2015-08-25 ENCOUNTER — Other Ambulatory Visit: Payer: Self-pay | Admitting: Family Medicine

## 2015-09-09 ENCOUNTER — Emergency Department (HOSPITAL_COMMUNITY)
Admission: EM | Admit: 2015-09-09 | Discharge: 2015-09-09 | Disposition: A | Discharge status: Home / Self Care | Payer: Managed Care, Other (non HMO) | Attending: Emergency Medicine | Admitting: Emergency Medicine

## 2015-09-09 ENCOUNTER — Emergency Department (HOSPITAL_COMMUNITY): Payer: Managed Care, Other (non HMO)

## 2015-09-09 ENCOUNTER — Encounter (HOSPITAL_COMMUNITY): Payer: Self-pay | Admitting: Emergency Medicine

## 2015-09-09 DIAGNOSIS — X509XXA Other and unspecified overexertion or strenuous movements or postures, initial encounter: Secondary | ICD-10-CM | POA: Diagnosis not present

## 2015-09-09 DIAGNOSIS — I10 Essential (primary) hypertension: Secondary | ICD-10-CM | POA: Diagnosis not present

## 2015-09-09 DIAGNOSIS — Y939 Activity, unspecified: Secondary | ICD-10-CM | POA: Diagnosis not present

## 2015-09-09 DIAGNOSIS — Y999 Unspecified external cause status: Secondary | ICD-10-CM | POA: Insufficient documentation

## 2015-09-09 DIAGNOSIS — Y929 Unspecified place or not applicable: Secondary | ICD-10-CM | POA: Diagnosis not present

## 2015-09-09 DIAGNOSIS — S8991XA Unspecified injury of right lower leg, initial encounter: Secondary | ICD-10-CM | POA: Insufficient documentation

## 2015-09-09 NOTE — ED Notes (Signed)
Patient Alert and oriented X4. Stable and ambulatory. Patient verbalized understanding of the discharge instructions.  Patient belongings were taken by the patient.  

## 2015-09-09 NOTE — Progress Notes (Signed)
Orthopedic Tech Progress Note Patient Details:  Barbara Giles 02/27/1981 [id number]  Ortho Devices Type of Ortho Device: Knee Immobilizer, Crutches Ortho Device/Splint Location: RLE knee Ortho Device/Splint Interventions: Ordered, Application   Jennye MoccasinHughes, Barbara Giles 09/09/2015, 10:54 PM

## 2015-09-09 NOTE — ED Notes (Signed)
Ortho Tech at bedside to apply knee immobilizer and crutches

## 2015-09-09 NOTE — ED Notes (Signed)
Pt. reports pain at right knee " felt a pop" this evening , denies fall or injury , ambulatory .

## 2015-09-09 NOTE — ED Provider Notes (Signed)
CSN: [id number]     Arrival date & time 09/09/15  2146 History  By signing my name below, I, Barbara Giles, attest that this documentation has been prepared under the direction and in the presence of Kerrie BuffaloHope Neese, NP Electronically Signed: Soijett Giles, ED Scribe. 09/09/2015. 10:38 PM.   Chief Complaint  Patient presents with  . Knee Pain      Patient is a 35 y.o. female presenting with knee pain. The history is provided by the patient. No language interpreter was used.  Knee Pain Location:  Knee Time since incident:  3 hours Injury: no   Knee location:  R knee Pain details:    Quality:  Unable to specify   Radiates to:  Does not radiate   Severity:  Mild   Onset quality:  Sudden   Duration:  3 hours   Timing:  Constant   Progression:  Unchanged Chronicity:  New Foreign body present:  No foreign bodies Tetanus status:  Unknown Prior injury to area:  Yes Relieved by:  None tried Worsened by:  Nothing tried Ineffective treatments:  None tried Associated symptoms: no numbness, no swelling and no tingling     Barbara Giles is a 35 y.o. female with a medical hx of HTN who presents to the Emergency Department complaining of constant, mild, right knee pain onset 2.5-3 hours ago. Pt notes that she turned with her feet planted and felt a "pop" to her right knee. Pt notes that there is a "tightness" sensation to her right knee. Denies any recent fall or injury to the right knee. Pt states that she has torn her meniscus in the past and was dx and treated for a partial tear at [facility]. She notes that she has not tried any medications for the relief of her symptoms. Pt reports that she will take aleve if she needs to take any medications. She denies color change, wound, rash, swelling, gait problem, tingling, numbness, and any other symptoms.    Past Medical History  Diagnosis Date  . Bronchitis   . Obesity   . Hypertension    Past Surgical History  Procedure Laterality  Date  . Tonsillectomy     Family History  Problem Relation Age of Onset  . Hypertension Mother   . Hyperlipidemia Mother   . Depression Father   . Myasthenia gravis Father   . Cancer Father     pancreatic cancer  . Diabetes Father   . Hyperlipidemia Father   . Mental illness Father   . Cancer Sister     breast cancer  . Hyperlipidemia Sister   . Hypertension Sister   . Cancer Paternal Aunt     breast cancer  . Mental illness Paternal Grandmother   . Hyperlipidemia Paternal Grandfather    Social History  Substance Use Topics  . Smoking status: Never Smoker   . Smokeless tobacco: Never Used  . Alcohol Use: Yes     Comment: rarely   OB History    Gravida Para Term Preterm AB TAB SAB Ectopic Multiple Living   1 1             Review of Systems  Musculoskeletal: Positive for arthralgias (right knee). Negative for joint swelling and gait problem.  Skin: Negative for color change, rash and wound.  Neurological: Negative for numbness.       No tingling  All other systems reviewed and are negative.     Allergies  Review of patient's allergies indicates  no known allergies.  Home Medications   Prior to Admission medications   Medication Sig Start Date End Date Taking? Authorizing Provider  lisinopril (PRINIVIL,ZESTRIL) 10 MG tablet Take 1 tablet (10 mg total) by mouth daily. 08/25/15  Yes Ethelda ChickKristi M Smith, MD  naproxen sodium (ANAPROX) 220 MG tablet Take 220 mg by mouth 2 (two) times daily as needed (for pain). Reported on 08/01/2015   Yes Historical Provider, MD  benzonatate (TESSALON) 200 MG capsule Take 1 capsule (200 mg total) by mouth 2 (two) times daily as needed for cough. Patient not taking: Reported on 09/09/2015 08/08/15   Ofilia NeasMichael L Clark, PA-C  ergocalciferol (VITAMIN D2) 50000 UNITS capsule Take 1 capsule (50,000 Units total) by mouth once a week. Patient not taking: Reported on 08/01/2015 12/28/14   Sherren MochaEva N Shaw, MD  Hydrocodone-Homatropine (TUSSIGON) 5-1.5 MG TABS  Take 1 tablet by mouth at bedtime. Patient not taking: Reported on 09/09/2015 08/08/15   Ofilia NeasMichael L Clark, PA-C   BP 137/82 mmHg  Pulse 98  Temp(Src) 98.7 F (37.1 C) (Oral)  Resp 16  Ht 5' 0.5" (1.537 m)  Wt 113.853 kg  BMI 48.19 kg/m2  SpO2 100%  LMP 08/23/2015 (Approximate) Physical Exam  Constitutional: She is oriented to person, place, and time. She appears well-developed and well-nourished. No distress.  HENT:  Head: Normocephalic and atraumatic.  Eyes: EOM are normal.  Neck: Neck supple.  Cardiovascular: Normal rate.   Distal pulses 2+. Adequate circulation.   Pulmonary/Chest: Effort normal. No respiratory distress.  Abdominal: She exhibits no distension.  Musculoskeletal: Normal range of motion.  Right knee with FROM without pain. unable to reproduce the pain that the pt states that she has had. Negative SLR.   Neurological: She is alert and oriented to person, place, and time. No sensory deficit.  Good touch sensation.  Skin: Skin is warm and dry.  Psychiatric: She has a normal mood and affect. Her behavior is normal.  Nursing note and vitals reviewed.   ED Course  Procedures (including critical care time) DIAGNOSTIC STUDIES: Oxygen Saturation is 100% on RA, nl by my interpretation.    COORDINATION OF CARE: 10:37 PM Discussed treatment plan with pt at bedside which includes right knee xray, knee immobilizer, crutches, follow up with orthopedist, aleve, and pt agreed to plan.   Imaging Review Dg Knee Complete 4 Views Right  09/09/2015  CLINICAL DATA:  Twisting injury today with audible pop and subsequent pain. EXAM: RIGHT KNEE - COMPLETE 4+ VIEW COMPARISON:  02/21/2011 FINDINGS: No joint effusion. There has been worsening of medial compartment osteoarthritis with mild joint space narrowing and small marginal osteophytes. Since the previous study, there is slight spurring of the tibial spines and there is a well corticated angular calcification adjacent to the spines that  could relate to an old cruciate ligament injury or degenerative calcification. Because of the well corticated nature, acute fracture is not favored. IMPRESSION: Worsening of medial compartment degenerative change since 2012. No joint effusion. Spurring of the tibial spines. Well corticated calcific density adjacent to the tibial spines. These are favored to represent degenerative or old traumatic changes rather than evidence of an acute injury. Electronically Signed   By: Paulina FusiMark  Shogry M.D.   On: 09/09/2015 22:26    MDM   Final diagnoses:  Knee injury, right, initial encounter    Patient X-Ray negative for obvious fracture or dislocation.  Pt advised to follow up with orthopedics. Patient given knee immobilizer and crutches while in ED, conservative therapy  recommended and discussed. Patient will be discharged home & is agreeable with above plan. Returns precautions discussed. Pt appears safe for discharge.   I personally performed the services described in this documentation, which was scribed in my presence. The recorded information has been reviewed and is accurate.    [address] [place], [place] 09/10/15 1191  Leta Baptist, MD 09/11/15 [phone]

## 2015-09-12 ENCOUNTER — Other Ambulatory Visit: Payer: Self-pay | Admitting: Surgery

## 2015-09-12 DIAGNOSIS — M25561 Pain in right knee: Secondary | ICD-10-CM

## 2015-09-13 ENCOUNTER — Ambulatory Visit
Admission: RE | Admit: 2015-09-13 | Discharge: 2015-09-13 | Disposition: A | Discharge status: Home / Self Care | Payer: Managed Care, Other (non HMO) | Source: Ambulatory Visit | Attending: Surgery | Admitting: Surgery

## 2015-09-13 DIAGNOSIS — M25561 Pain in right knee: Secondary | ICD-10-CM

## 2015-09-16 ENCOUNTER — Other Ambulatory Visit: Payer: Managed Care, Other (non HMO)

## 2015-09-25 ENCOUNTER — Other Ambulatory Visit: Payer: Self-pay | Admitting: Orthopedic Surgery

## 2015-09-26 ENCOUNTER — Other Ambulatory Visit (HOSPITAL_COMMUNITY): Payer: Self-pay | Admitting: *Deleted

## 2015-09-26 NOTE — Pre-Procedure Instructions (Addendum)
Benjaman Lobellison E Molenda  09/26/2015      [facility] [phone] - [place] - [address] AT [facility] [address] [place] [address] Phone: [phone] Fax: [phone]  [facility] [place] [address] - [place], [place] - [id number] [facility] [address] [place] [address] Phone: [phone] Fax: [phone]  [facility] [address] - [place], [place] - [address] [place] [address] Phone: [phone] Fax: [phone]    Your procedure is scheduled on Tuesday, September 30, 2015 .   [facility] at 5:30 AM.   Call this number if you have problems the morning of surgery: [phone]    Remember:  Do not eat food or drink liquids after midnight tonight.  STOP all herbel meds, nsaids (aleve,naproxen,advil,ibuprofen) 5 days prior to surgery including vitamins, aspirin    Do not wear jewelry, make-up or nail polish.  Do not wear lotions, powders, or perfumes.  You may wear deodorant.  Do not shave 48 hours prior to surgery.   Do not bring valuables to the hospital.  [facility] is not responsible for any belongings or valuables.  Contacts, dentures or bridgework may not be worn into surgery.  Leave your suitcase in the car.  After surgery it may be brought to your room.  For patients admitted to the hospital, discharge time will be determined by your treatment team.  Patients discharged the day of surgery will not be allowed to drive home.   Special instructions:  [place] - Preparing for Surgery  Before surgery, you can play an important role.  Because skin is not sterile, your skin needs to be as free of germs as possible.  You can reduce the number of germs on you skin by washing with CHG (chlorahexidine gluconate) soap before surgery.  CHG is an antiseptic cleaner which kills germs and bonds with the skin to continue killing  germs even after washing.  Please DO NOT use if you have an allergy to CHG or antibacterial soaps.  If your skin becomes reddened/irritated stop using the CHG and inform your nurse when you arrive at Short Stay.  Do not shave (including legs and underarms) for at least 48 hours prior to the first CHG shower.  You may shave your face.  Please follow these instructions carefully:   1.  Shower with CHG Soap the night before surgery and the                                morning of Surgery.  2.  If you choose to wash your hair, wash your hair first as usual with your       normal shampoo.  3.  After you shampoo, rinse your hair and body thoroughly to remove the                      Shampoo.  4.  Use CHG as you would any other liquid soap.  You can apply chg directly       to the skin and wash gently with scrungie or a clean washcloth.  5.  Apply the CHG Soap to your body ONLY FROM THE NECK DOWN.        Do not use on open wounds or open sores.  Avoid contact with  your eyes, ears, mouth and genitals (private parts).  Wash genitals (private parts) with your normal soap.  6.  Wash thoroughly, paying special attention to the area where your surgery        will be performed.  7.  Thoroughly rinse your body with warm water from the neck down.  8.  DO NOT shower/wash with your normal soap after using and rinsing off       the CHG Soap.  9.  Pat yourself dry with a clean towel.            10.  Wear clean pajamas.            11.  Place clean sheets on your bed the night of your first shower and do not        sleep with pets.  Day of Surgery  Do not apply any lotions the morning of surgery.  Please wear clean clothes to the hospital.   Please read over the following fact sheets that you were given.

## 2015-09-26 NOTE — Progress Notes (Addendum)
Anesthesia Note: Patient is a 35 year old female scheduled for right reconstruction ACL with Hamstring graft, partial medial meniscectomy on 09/30/15 by Dr. August Saucerean. PAT is scheduled for 09/29/15 9:15 AM.  History includes non-smoker, HTN, bronchitis, morbid obesity, tonsillectomy. ED visit 02/11/15 for right sided chest pain--troponin, d-dimer, CXR, pregnancy test negative--and diagnosed with musculoskeletal pain. ED records including EKG were reviewed by her PCP Dr. Carley HammedEva Shaw--Maxzide was changed to lisinopril.  Meds include lisinopril.  02/11/15 EKG: ST at 115, septal infarct (age undetermined), T wave abnormality, consider inferior ischemia. No old tracing to compare.  02/11/15 CXR: IMPRESSION: Negative chest.  History and 02/11/15 EKG reviewed with anesthesiologist Dr. Jacklynn BueMassagee by Rica MastAngela Kabbe, FNP-BC. He recommended repeating EKG at her PAT visit on Monday. Will follow-up results at that time.  Velna Ochsllison Betsey Sossamon, PA-C [facility]/Anesthesiology Phone [phone] 09/26/2015 5:55 PM  Addendum: EKG repeated today and showed NSR, low QRS voltage (particularly in inferior lead aVF). Q wave in III. Probable tiny (insignificant) q wave in aVF. Final confirmation still pending from cardiologist. CBC and BMET WNL. Serum pregnancy was negative. Further evaluation by her assigned anesthesiologist on the day of surgery to determine the definitve anesthesia plan. BP 128/83, HR 84 today.  Velna Ochsllison Shivansh Hardaway, PA-C [facility]Anesthesiology Phone [phone] 09/29/2015 3:41 PM

## 2015-09-29 ENCOUNTER — Encounter (HOSPITAL_COMMUNITY): Payer: Self-pay

## 2015-09-29 ENCOUNTER — Other Ambulatory Visit (HOSPITAL_COMMUNITY): Payer: Managed Care, Other (non HMO)

## 2015-09-29 ENCOUNTER — Encounter (HOSPITAL_COMMUNITY)
Admission: RE | Admit: 2015-09-29 | Discharge: 2015-09-29 | Disposition: A | Discharge status: Home / Self Care | Payer: Managed Care, Other (non HMO) | Source: Ambulatory Visit | Attending: Orthopedic Surgery | Admitting: Orthopedic Surgery

## 2015-09-29 DIAGNOSIS — Z3202 Encounter for pregnancy test, result negative: Secondary | ICD-10-CM | POA: Diagnosis not present

## 2015-09-29 DIAGNOSIS — S83511A Sprain of anterior cruciate ligament of right knee, initial encounter: Secondary | ICD-10-CM | POA: Diagnosis not present

## 2015-09-29 DIAGNOSIS — S83241A Other tear of medial meniscus, current injury, right knee, initial encounter: Secondary | ICD-10-CM | POA: Diagnosis not present

## 2015-09-29 DIAGNOSIS — I10 Essential (primary) hypertension: Secondary | ICD-10-CM | POA: Diagnosis not present

## 2015-09-29 DIAGNOSIS — X58XXXA Exposure to other specified factors, initial encounter: Secondary | ICD-10-CM | POA: Diagnosis not present

## 2015-09-29 DIAGNOSIS — Z6841 Body Mass Index (BMI) 40.0 and over, adult: Secondary | ICD-10-CM | POA: Diagnosis not present

## 2015-09-29 HISTORY — DX: Personal history of urinary calculi: Z87.442

## 2015-09-29 LAB — BASIC METABOLIC PANEL
Anion gap: 7 (ref 5–15)
BUN: 11 mg/dL (ref 6–20)
CHLORIDE: 103 mmol/L (ref 101–111)
CO2: 25 mmol/L (ref 22–32)
CREATININE: 0.62 mg/dL (ref 0.44–1.00)
Calcium: 9 mg/dL (ref 8.9–10.3)
GFR calc Af Amer: >60 mL/min (ref >60)
GFR calc non Af Amer: >60 mL/min (ref >60)
GLUCOSE: 93 mg/dL (ref 65–99)
Potassium: 4.2 mmol/L (ref 3.5–5.1)
Sodium: 135 mmol/L (ref 135–145)

## 2015-09-29 LAB — CBC
HCT: 38.8 % (ref 36.0–46.0)
Hemoglobin: 12.6 g/dL (ref 12.0–15.0)
MCH: 27.2 pg (ref 26.0–34.0)
MCHC: 32.5 g/dL (ref 30.0–36.0)
MCV: 83.8 fL (ref 78.0–100.0)
PLATELETS: 315 10*3/uL (ref 150–400)
RBC: 4.63 MIL/uL (ref 3.87–5.11)
RDW: 13.6 % (ref 11.5–15.5)
WBC: 8.4 10*3/uL (ref 4.0–10.5)

## 2015-09-29 LAB — HCG, SERUM, QUALITATIVE: Preg, Serum: NEGATIVE

## 2015-09-29 NOTE — Anesthesia Preprocedure Evaluation (Addendum)
Anesthesia Evaluation  Patient identified by MRN, date of birth, ID band Patient awake    Reviewed: Allergy & Precautions, H&P , NPO status , Patient's Chart, lab work & pertinent test results  Airway Mallampati: III  TM Distance: >3 FB Neck ROM: Full    Dental no notable dental hx. (+) Teeth Intact, Dental Advisory Given   Pulmonary neg pulmonary ROS,    Pulmonary exam normal breath sounds clear to auscultation       Cardiovascular hypertension, Pt. on medications  Rhythm:Regular Rate:Normal     Neuro/Psych negative neurological ROS  negative psych ROS   GI/Hepatic negative GI ROS, Neg liver ROS,   Endo/Other  Morbid obesity  Renal/GU negative Renal ROS  negative genitourinary   Musculoskeletal   Abdominal   Peds  Hematology negative hematology ROS (+)   Anesthesia Other Findings   Reproductive/Obstetrics negative OB ROS                            Anesthesia Physical Anesthesia Plan  ASA: III  Anesthesia Plan: Regional and General   Post-op Pain Management: GA combined w/ Regional for post-op pain   Induction: Intravenous  Airway Management Planned: Oral ETT and LMA  Additional Equipment:   Intra-op Plan:   Post-operative Plan: Extubation in OR  Informed Consent: I have reviewed the patients History and Physical, chart, labs and discussed the procedure including the risks, benefits and alternatives for the proposed anesthesia with the patient or authorized representative who has indicated his/her understanding and acceptance.   Dental advisory given  Plan Discussed with: CRNA  Anesthesia Plan Comments:        Anesthesia Quick Evaluation

## 2015-09-30 ENCOUNTER — Encounter (HOSPITAL_COMMUNITY): Payer: Self-pay | Admitting: *Deleted

## 2015-09-30 ENCOUNTER — Ambulatory Visit (HOSPITAL_COMMUNITY)
Admission: RE | Admit: 2015-09-30 | Discharge: 2015-09-30 | Disposition: A | Discharge status: Home / Self Care | Payer: Managed Care, Other (non HMO) | Source: Ambulatory Visit | Attending: Orthopedic Surgery | Admitting: Orthopedic Surgery

## 2015-09-30 ENCOUNTER — Ambulatory Visit (HOSPITAL_COMMUNITY): Payer: Managed Care, Other (non HMO) | Admitting: Vascular Surgery

## 2015-09-30 ENCOUNTER — Encounter (HOSPITAL_COMMUNITY)
Admission: RE | Disposition: A | Discharge status: Home / Self Care | Payer: Self-pay | Source: Ambulatory Visit | Attending: Orthopedic Surgery

## 2015-09-30 ENCOUNTER — Ambulatory Visit (HOSPITAL_COMMUNITY): Payer: Managed Care, Other (non HMO) | Admitting: Emergency Medicine

## 2015-09-30 DIAGNOSIS — Z6841 Body Mass Index (BMI) 40.0 and over, adult: Secondary | ICD-10-CM | POA: Insufficient documentation

## 2015-09-30 DIAGNOSIS — S83241A Other tear of medial meniscus, current injury, right knee, initial encounter: Secondary | ICD-10-CM | POA: Insufficient documentation

## 2015-09-30 DIAGNOSIS — X58XXXA Exposure to other specified factors, initial encounter: Secondary | ICD-10-CM | POA: Insufficient documentation

## 2015-09-30 DIAGNOSIS — S83511A Sprain of anterior cruciate ligament of right knee, initial encounter: Secondary | ICD-10-CM | POA: Insufficient documentation

## 2015-09-30 DIAGNOSIS — Z3202 Encounter for pregnancy test, result negative: Secondary | ICD-10-CM | POA: Insufficient documentation

## 2015-09-30 DIAGNOSIS — I10 Essential (primary) hypertension: Secondary | ICD-10-CM | POA: Insufficient documentation

## 2015-09-30 HISTORY — PX: MENISECTOMY: SHX5181

## 2015-09-30 HISTORY — PX: ANTERIOR CRUCIATE LIGAMENT REPAIR: SHX115

## 2015-09-30 SURGERY — RECONSTRUCTION, KNEE, ACL, USING HAMSTRING GRAFT
Anesthesia: Regional | Site: Knee | Laterality: Right

## 2015-09-30 MED ORDER — ACETAMINOPHEN 10 MG/ML IV SOLN
INTRAVENOUS | Status: AC
  Filled 2015-09-30: qty 100

## 2015-09-30 MED ORDER — FENTANYL CITRATE (PF) 250 MCG/5ML IJ SOLN
INTRAMUSCULAR | Status: AC
  Filled 2015-09-30: qty 5

## 2015-09-30 MED ORDER — BUPIVACAINE-EPINEPHRINE (PF) 0.5% -1:200000 IJ SOLN
INTRAMUSCULAR | Status: DC | PRN
  Administered 2015-09-30: 30 mL via PERINEURAL

## 2015-09-30 MED ORDER — CLONIDINE HCL (ANALGESIA) 100 MCG/ML EP SOLN
EPIDURAL | Status: DC | PRN
  Administered 2015-09-30: 1 mL via INTRA_ARTICULAR

## 2015-09-30 MED ORDER — EPINEPHRINE HCL 1 MG/ML IJ SOLN
INTRAMUSCULAR | Status: DC | PRN
  Administered 2015-09-30: 4 mL

## 2015-09-30 MED ORDER — BUPIVACAINE HCL (PF) 0.25 % IJ SOLN
INTRAMUSCULAR | Status: DC | PRN
  Administered 2015-09-30: 20 mL

## 2015-09-30 MED ORDER — BUPIVACAINE-EPINEPHRINE (PF) 0.5% -1:200000 IJ SOLN
INTRAMUSCULAR | Status: AC
  Filled 2015-09-30: qty 30

## 2015-09-30 MED ORDER — ROCURONIUM BROMIDE 100 MG/10ML IV SOLN
INTRAVENOUS | Status: DC | PRN
  Administered 2015-09-30: 50 mg via INTRAVENOUS
  Administered 2015-09-30: 30 mg via INTRAVENOUS

## 2015-09-30 MED ORDER — CLONIDINE HCL (ANALGESIA) 100 MCG/ML EP SOLN
150.0000 ug | EPIDURAL | Status: DC
  Filled 2015-09-30: qty 1.5

## 2015-09-30 MED ORDER — DEXAMETHASONE SODIUM PHOSPHATE 4 MG/ML IJ SOLN
INTRAMUSCULAR | Status: DC | PRN
  Administered 2015-09-30: 10 mg via INTRAVENOUS

## 2015-09-30 MED ORDER — BUPIVACAINE-EPINEPHRINE (PF) 0.5% -1:200000 IJ SOLN
INTRAMUSCULAR | Status: DC | PRN
  Administered 2015-09-30: 19 mL

## 2015-09-30 MED ORDER — KETOROLAC TROMETHAMINE 30 MG/ML IJ SOLN
INTRAMUSCULAR | Status: DC | PRN
  Administered 2015-09-30: 30 mg via INTRAVENOUS

## 2015-09-30 MED ORDER — KETOROLAC TROMETHAMINE 30 MG/ML IJ SOLN
INTRAMUSCULAR | Status: AC
  Filled 2015-09-30: qty 1

## 2015-09-30 MED ORDER — PHENYLEPHRINE HCL 10 MG/ML IJ SOLN
INTRAMUSCULAR | Status: DC | PRN
  Administered 2015-09-30 (×3): 80 ug via INTRAVENOUS

## 2015-09-30 MED ORDER — CHLORHEXIDINE GLUCONATE 4 % EX LIQD: 60.0000 mL | Freq: Once | CUTANEOUS | Status: DC

## 2015-09-30 MED ORDER — PROPOFOL 1000 MG/100ML IV EMUL
INTRAVENOUS | Status: AC
  Filled 2015-09-30: qty 200

## 2015-09-30 MED ORDER — PROPOFOL 10 MG/ML IV BOLUS
INTRAVENOUS | Status: DC | PRN
  Administered 2015-09-30: 200 mg via INTRAVENOUS

## 2015-09-30 MED ORDER — SODIUM CHLORIDE 0.9 % IR SOLN
Status: DC | PRN
  Administered 2015-09-30 (×4): 3000 mL

## 2015-09-30 MED ORDER — PHENYLEPHRINE 40 MCG/ML (10ML) SYRINGE FOR IV PUSH (FOR BLOOD PRESSURE SUPPORT)
PREFILLED_SYRINGE | INTRAVENOUS | Status: AC
  Filled 2015-09-30: qty 10

## 2015-09-30 MED ORDER — MIDAZOLAM HCL 5 MG/5ML IJ SOLN
INTRAMUSCULAR | Status: DC | PRN
  Administered 2015-09-30: 2 mg via INTRAVENOUS

## 2015-09-30 MED ORDER — CEFAZOLIN SODIUM-DEXTROSE 2-4 GM/100ML-% IV SOLN
2.0000 g | INTRAVENOUS | Status: AC
  Administered 2015-09-30: 2 g via INTRAVENOUS

## 2015-09-30 MED ORDER — LIDOCAINE HCL (CARDIAC) 20 MG/ML IV SOLN
INTRAVENOUS | Status: DC | PRN
  Administered 2015-09-30: 40 mg via INTRAVENOUS

## 2015-09-30 MED ORDER — ROCURONIUM BROMIDE 50 MG/5ML IV SOLN
INTRAVENOUS | Status: AC
  Filled 2015-09-30: qty 1

## 2015-09-30 MED ORDER — BUPIVACAINE HCL (PF) 0.25 % IJ SOLN
INTRAMUSCULAR | Status: AC
  Filled 2015-09-30: qty 30

## 2015-09-30 MED ORDER — LACTATED RINGERS IV SOLN
INTRAVENOUS | Status: DC | PRN
  Administered 2015-09-30 (×2): via INTRAVENOUS

## 2015-09-30 MED ORDER — 0.9 % SODIUM CHLORIDE (POUR BTL) OPTIME
TOPICAL | Status: DC | PRN
  Administered 2015-09-30: 1000 mL

## 2015-09-30 MED ORDER — EPINEPHRINE HCL 1 MG/ML IJ SOLN
INTRAMUSCULAR | Status: AC
  Filled 2015-09-30: qty 6

## 2015-09-30 MED ORDER — METHOCARBAMOL 500 MG PO TABS: 500.0000 mg | ORAL_TABLET | Freq: Three times a day (TID) | ORAL | 0 refills | Status: DC

## 2015-09-30 MED ORDER — MIDAZOLAM HCL 2 MG/2ML IJ SOLN
INTRAMUSCULAR | Status: AC
Start: 2015-09-30 — End: 2015-09-30
  Filled 2015-09-30: qty 2

## 2015-09-30 MED ORDER — METHOCARBAMOL 500 MG PO TABS
ORAL_TABLET | ORAL | Status: AC
  Administered 2015-09-30: 500 mg
  Filled 2015-09-30: qty 1

## 2015-09-30 MED ORDER — LIDOCAINE 2% (20 MG/ML) 5 ML SYRINGE
INTRAMUSCULAR | Status: AC
  Filled 2015-09-30: qty 5

## 2015-09-30 MED ORDER — HYDROMORPHONE HCL 1 MG/ML IJ SOLN
INTRAMUSCULAR | Status: AC
  Administered 2015-09-30: 0.5 mg via INTRAVENOUS
  Filled 2015-09-30: qty 1

## 2015-09-30 MED ORDER — OXYCODONE-ACETAMINOPHEN 5-325 MG PO TABS
ORAL_TABLET | ORAL | Status: AC
  Filled 2015-09-30: qty 1

## 2015-09-30 MED ORDER — HYDROMORPHONE HCL 1 MG/ML IJ SOLN
0.2500 mg | INTRAMUSCULAR | Status: DC | PRN
  Administered 2015-09-30 (×2): 0.5 mg via INTRAVENOUS

## 2015-09-30 MED ORDER — OXYCODONE-ACETAMINOPHEN 5-325 MG PO TABS
ORAL_TABLET | ORAL | Status: AC
  Administered 2015-09-30: 1
  Filled 2015-09-30: qty 1

## 2015-09-30 MED ORDER — RIVAROXABAN 10 MG PO TABS: 10.0000 mg | ORAL_TABLET | Freq: Every day | ORAL | 0 refills | Status: DC

## 2015-09-30 MED ORDER — MORPHINE SULFATE (PF) 4 MG/ML IV SOLN
INTRAVENOUS | Status: AC
  Filled 2015-09-30: qty 2

## 2015-09-30 MED ORDER — MORPHINE SULFATE (PF) 4 MG/ML IV SOLN
INTRAVENOUS | Status: DC | PRN
  Administered 2015-09-30: 8 mg

## 2015-09-30 MED ORDER — ACETAMINOPHEN 10 MG/ML IV SOLN
INTRAVENOUS | Status: DC | PRN
  Administered 2015-09-30: 1000 mg via INTRAVENOUS

## 2015-09-30 MED ORDER — FENTANYL CITRATE (PF) 250 MCG/5ML IJ SOLN
INTRAMUSCULAR | Status: DC | PRN
  Administered 2015-09-30 (×2): 50 ug via INTRAVENOUS
  Administered 2015-09-30 (×2): 100 ug via INTRAVENOUS
  Administered 2015-09-30: 50 ug via INTRAVENOUS

## 2015-09-30 MED ORDER — SUGAMMADEX SODIUM 200 MG/2ML IV SOLN
INTRAVENOUS | Status: DC | PRN
  Administered 2015-09-30: 400 mg via INTRAVENOUS

## 2015-09-30 MED ORDER — SUGAMMADEX SODIUM 500 MG/5ML IV SOLN
INTRAVENOUS | Status: AC
  Filled 2015-09-30: qty 5

## 2015-09-30 MED ORDER — ONDANSETRON HCL 4 MG/2ML IJ SOLN
INTRAMUSCULAR | Status: DC | PRN
  Administered 2015-09-30: 4 mg via INTRAVENOUS

## 2015-09-30 MED ORDER — CEFAZOLIN SODIUM-DEXTROSE 2-4 GM/100ML-% IV SOLN
INTRAVENOUS | Status: AC
  Filled 2015-09-30: qty 100

## 2015-09-30 MED ORDER — OXYCODONE-ACETAMINOPHEN 5-325 MG PO TABS: 1.0000 | ORAL_TABLET | Freq: Four times a day (QID) | ORAL | 0 refills | Status: DC | PRN

## 2015-09-30 MED ORDER — PROPOFOL 10 MG/ML IV BOLUS
INTRAVENOUS | Status: AC
  Filled 2015-09-30: qty 20

## 2015-09-30 SURGICAL SUPPLY — 94 items
ANCHOR BUTTON TIGHTROPE ACL RT (Orthopedic Implant) ×2 IMPLANT
BANDAGE ACE 6X5 VEL STRL LF (GAUZE/BANDAGES/DRESSINGS) ×1 IMPLANT
BANDAGE ESMARK 6X9 LF (GAUZE/BANDAGES/DRESSINGS) IMPLANT
BLADE CUTTER GATOR 3.5 (BLADE) ×1 IMPLANT
BLADE GREAT WHITE 4.2 (BLADE) ×2 IMPLANT
BLADE SURG 10 STRL SS (BLADE) ×2 IMPLANT
BLADE SURG 15 STRL LF DISP TIS (BLADE) ×2 IMPLANT
BLADE SURG 15 STRL SS (BLADE) ×4
BNDG CMPR 9X6 STRL LF SNTH (GAUZE/BANDAGES/DRESSINGS)
BNDG CMPR MED 15X6 ELC VLCR LF (GAUZE/BANDAGES/DRESSINGS) ×1
BNDG ELASTIC 6X15 VLCR STRL LF (GAUZE/BANDAGES/DRESSINGS) ×2 IMPLANT
BNDG ESMARK 6X9 LF (GAUZE/BANDAGES/DRESSINGS)
BONE MATRIX DEMINERALIZED 1CC (Bone Implant) ×2 IMPLANT
BUR OVAL 6.0 (BURR) ×2 IMPLANT
COVER MAYO STAND STRL (DRAPES) IMPLANT
COVER SURGICAL LIGHT HANDLE (MISCELLANEOUS) ×2 IMPLANT
CUFF TOURNIQUET SINGLE 34IN LL (TOURNIQUET CUFF) IMPLANT
CUFF TOURNIQUET SINGLE 44IN (TOURNIQUET CUFF) ×1 IMPLANT
DECANTER SPIKE VIAL GLASS SM (MISCELLANEOUS) ×3 IMPLANT
DRAPE ARTHROSCOPY W/POUCH 114 (DRAPES) ×2 IMPLANT
DRAPE INCISE IOBAN 66X45 STRL (DRAPES) ×2 IMPLANT
DRAPE ORTHO SPLIT 77X108 STRL (DRAPES) ×2
DRAPE SURG ORHT 6 SPLT 77X108 (DRAPES) IMPLANT
DRAPE U-SHAPE 47X51 STRL (DRAPES) ×2 IMPLANT
DRILL FLIPCUTTER II 7.5MM (MISCELLANEOUS) IMPLANT
DRILL FLIPCUTTER II 8.0MM (INSTRUMENTS) IMPLANT
DRILL FLIPCUTTER II 8.5MM (INSTRUMENTS) IMPLANT
DRILL FLIPCUTTER II 9.0MM (INSTRUMENTS) IMPLANT
DRSG PAD ABDOMINAL 8X10 ST (GAUZE/BANDAGES/DRESSINGS) ×2 IMPLANT
DRSG TEGADERM 4X4.75 (GAUZE/BANDAGES/DRESSINGS) ×8 IMPLANT
DURAPREP 26ML APPLICATOR (WOUND CARE) ×4 IMPLANT
ELECT REM PT RETURN 9FT ADLT (ELECTROSURGICAL) ×2
ELECTRODE REM PT RTRN 9FT ADLT (ELECTROSURGICAL) ×1 IMPLANT
FLIPCUTTER II 7.5MM (MISCELLANEOUS)
FLIPCUTTER II 8.0MM (INSTRUMENTS) ×2
FLIPCUTTER II 8.5MM (INSTRUMENTS)
FLIPCUTTER II 9.0MM (INSTRUMENTS)
GAUZE SPONGE 4X4 12PLY STRL (GAUZE/BANDAGES/DRESSINGS) ×4 IMPLANT
GAUZE XEROFORM 1X8 LF (GAUZE/BANDAGES/DRESSINGS) ×3 IMPLANT
GLOVE BIO SURGEON STRL SZ 6.5 (GLOVE) ×1 IMPLANT
GLOVE BIOGEL PI IND STRL 6.5 (GLOVE) IMPLANT
GLOVE BIOGEL PI IND STRL 8 (GLOVE) ×2 IMPLANT
GLOVE BIOGEL PI INDICATOR 6.5 (GLOVE) ×2
GLOVE BIOGEL PI INDICATOR 8 (GLOVE) ×2
GLOVE ORTHO TXT STRL SZ7.5 (GLOVE) ×2 IMPLANT
GLOVE SURG ORTHO 8.0 STRL STRW (GLOVE) ×2 IMPLANT
GOWN STRL REUS W/ TWL LRG LVL3 (GOWN DISPOSABLE) ×2 IMPLANT
GOWN STRL REUS W/TWL LRG LVL3 (GOWN DISPOSABLE) ×4
IMMOBILIZER KNEE 22 (SOFTGOODS) ×1 IMPLANT
IMMOBILIZER KNEE 22 UNIV (SOFTGOODS) ×2 IMPLANT
KIT BASIN OR (CUSTOM PROCEDURE TRAY) ×2 IMPLANT
KIT BIOCARTILAGE DEL W/SYRINGE (KITS) ×1 IMPLANT
KIT ROOM TURNOVER OR (KITS) ×2 IMPLANT
MANIFOLD NEPTUNE II (INSTRUMENTS) ×2 IMPLANT
NDL 18GX1X1/2 (RX/OR ONLY) (NEEDLE) ×1 IMPLANT
NDL SAFETY ECLIPSE 18X1.5 (NEEDLE) IMPLANT
NEEDLE 18GX1X1/2 (RX/OR ONLY) (NEEDLE) ×2 IMPLANT
NEEDLE HYPO 18GX1.5 SHARP (NEEDLE) ×2
NS IRRIG 1000ML POUR BTL (IV SOLUTION) ×2 IMPLANT
PACK ARTHROSCOPY DSU (CUSTOM PROCEDURE TRAY) ×2 IMPLANT
PAD ABD 8X10 STRL (GAUZE/BANDAGES/DRESSINGS) ×1 IMPLANT
PAD ARMBOARD 7.5X6 YLW CONV (MISCELLANEOUS) ×4 IMPLANT
PAD CAST 4YDX4 CTTN HI CHSV (CAST SUPPLIES) ×1 IMPLANT
PADDING CAST COTTON 4X4 STRL (CAST SUPPLIES) ×2
PADDING CAST COTTON 6X4 STRL (CAST SUPPLIES) ×4 IMPLANT
PENCIL BUTTON HOLSTER BLD 10FT (ELECTRODE) ×2 IMPLANT
PK GRAFTLINK AUTO IMPLANT SYST (Anchor) ×2 IMPLANT
SET ARTHROSCOPY TUBING (MISCELLANEOUS) ×2
SET ARTHROSCOPY TUBING LN (MISCELLANEOUS) ×1 IMPLANT
SPONGE GAUZE 4X4 12PLY STER LF (GAUZE/BANDAGES/DRESSINGS) ×2 IMPLANT
SPONGE LAP 18X18 X RAY DECT (DISPOSABLE) ×2 IMPLANT
SPONGE LAP 4X18 X RAY DECT (DISPOSABLE) ×4 IMPLANT
SPONGE SCRUB IODOPHOR (GAUZE/BANDAGES/DRESSINGS) ×2 IMPLANT
STRIP CLOSURE SKIN 1/2X4 (GAUZE/BANDAGES/DRESSINGS) ×2 IMPLANT
SUCTION FRAZIER HANDLE 10FR (MISCELLANEOUS) ×1
SUCTION TUBE FRAZIER 10FR DISP (MISCELLANEOUS) ×1 IMPLANT
SUT ETHILON 3 0 PS 1 (SUTURE) ×4 IMPLANT
SUT MNCRL AB 3-0 PS2 18 (SUTURE) ×2 IMPLANT
SUT VIC AB 0 CT1 27 (SUTURE) ×2
SUT VIC AB 0 CT1 27XBRD ANBCTR (SUTURE) ×1 IMPLANT
SUT VIC AB 2-0 CT1 27 (SUTURE) ×4
SUT VIC AB 2-0 CT1 TAPERPNT 27 (SUTURE) ×1 IMPLANT
SUT VICRYL 0 UR6 27IN ABS (SUTURE) ×3 IMPLANT
SYR 30ML LL (SYRINGE) ×2 IMPLANT
SYR 5ML LL (SYRINGE) ×1 IMPLANT
SYR BULB IRRIGATION 50ML (SYRINGE) ×2 IMPLANT
SYR TB 1ML LUER SLIP (SYRINGE) ×2 IMPLANT
SYSTEM GRAFT IMPLANT AUTOGRAFT (Anchor) ×1 IMPLANT
TOWEL OR 17X24 6PK STRL BLUE (TOWEL DISPOSABLE) ×2 IMPLANT
TOWEL OR 17X26 10 PK STRL BLUE (TOWEL DISPOSABLE) ×2 IMPLANT
UNDERPAD 30X30 INCONTINENT (UNDERPADS AND DIAPERS) ×2 IMPLANT
WAND 90 DEG TURBOVAC W/CORD (SURGICAL WAND) ×1 IMPLANT
WRAP KNEE MAXI GEL POST OP (GAUZE/BANDAGES/DRESSINGS) ×2 IMPLANT
YANKAUER SUCT BULB TIP NO VENT (SUCTIONS) ×1 IMPLANT

## 2015-09-30 NOTE — Transfer of Care (Signed)
Immediate Anesthesia Transfer of Care Note  Patient: Barbara Giles  Procedure(s) Performed: Procedure(s): RIGHT RECONSTRUCTION ANTERIOR CRUCIATE LIGAMENT (ACL) WITH HAMSTRING GRAFT (Right) PARTIAL MEDIAL MENISCECTOMY (Right)  Patient Location: PACU  Anesthesia Type:GA combined with regional for post-op pain  Level of Consciousness: awake, alert  and oriented  Airway & Oxygen Therapy: Patient Spontanous Breathing and Patient connected to nasal cannula oxygen  Post-op Assessment: Report given to RN and Post -op Vital signs reviewed and stable  Post vital signs: Reviewed and stable  Last Vitals:  Vitals:   09/30/15 0609 09/30/15 1020  BP: 132/72 110/67  Pulse: 94 (!) 125  Resp:  14  Temp: 36.9 C 36.6 C    Last Pain:  Vitals:   09/30/15 0609  TempSrc: Oral         Complications: No apparent anesthesia complications

## 2015-09-30 NOTE — Op Note (Signed)
NAME:  Barbara Giles, NEEDS NO.:  [id number]  MEDICAL RECORD NO.:  [id number]  LOCATION:  [facility]                         FACILITY:  [facility]  PHYSICIAN:  Burnard Bunting, M.D.    DATE OF BIRTH:  01-Nov-1980  DATE OF PROCEDURE: DATE OF DISCHARGE:                              OPERATIVE REPORT   PREOPERATIVE DIAGNOSIS:  Right knee anterior cruciate ligament, tear medial meniscal tear.  POSTOPERATIVE DIAGNOSIS:  Right knee anterior cruciate ligament, tear medial meniscal tear.  PROCEDURE:  Right knee anterior cruciate ligament reconstruction, hamstring and autograft, 8-mm graft with partial medial meniscectomy, fixation with dual Endobutton technique.  SURGEON:  Burnard Bunting, M.D.  ASSISTANT:  Patrick Jupiter, RNFA.  INDICATIONS:  Barbara Giles is a 35 year old patient with right knee ACL tear and symptomatic instability, presents for operative management after explanation of risks and benefits.  PROCEDURE IN DETAIL:  The patient was brought to the operating room where general anesthetic was induced.  Preoperative antibiotics were administered.  Time-out was called.  Right leg was examined under anesthesia, found to have the following findings: 1. Range of motion about 5-7 degrees of hyperextension to full flexion     with stability to varus and valgus stress at 0 and 30 degrees with     intact posterolateral corner, intact PCL, positive pivot shift, ACL     is out. 2. Diagnostic and operative arthroscopy:     a.     Intact patellofemoral compartment.     b.     Intact lateral compartment, articular cartilage and      meniscus.     c.     Early grade 1 chondromalacia changes over 8 x 8 mm area on      the medial femoral condyle with tear of the posterior horn of      medial meniscus involving about 15% anterior-posterior width of      the meniscus with small unstable flap.  The patient also had grade      1 changes over about 50% of the weightbearing surface area of the  medial tibial plateau.  ACL held.  PCL intact.  PROCEDURE IN DETAIL:  The patient was brought to the operating room where general anesthetic was induced.  Preoperative antibiotics were administered.  Time-out was called.  Right leg was prescrubbed with alcohol and Betadine, allowed to air dry, prepped with DuraPrep solution and draped in a sterile manner.  [name] was used to cover the operative field.  Leg was elevated, exsanguinated with the Esmarch wrap. Tourniquet was not used for the leg.  Incision was made over the pes bursa tendon.  Skin and subcutaneous tissue were sharply divided.  The semitendinosus tendon was then harvested, prepared on the back table by Patrick Jupiter using dual Endobutton technique to size 8 mm with tubularization of the graft using 2-0 FiberWire.  At this time, concurrent anterior inferolateral portals were established.  Anterior inferomedial portals were established under direct visualization. Diagnostic arthroscopy was performed.  At this time, anterior inferomedial portals were established under direct visualization. Diagnostic arthroscopy demonstrated these findings with torn ACL and the medial meniscal tear.  Meniscal tear was debrided back to stable rims with  combination of basket, punch and shaver.  Notchplasty performed. Over-the-top position identified in the 9 o'clock position on the right knee.  The lateral femoral condyle tunnel was drilled, about 1-2 mm back wall.  Tibial tunnel was then drilled to the posterior aspect of the native ACL footprint.  Graft was passed and secured into the StimuBlast- filled tunnels and secured in extension.  This gave excellent fixation, excellent range of motion.  Visual inspection performed and make sure that the Endobutton had flipped.  At this time, thorough irrigation was performed.  Instruments were removed.  The harvest incision site was closed using 0 Vicryl suture, 2-0 Vicryl suture and 3-0 Monocryl. Portals  were closed using 2-0 Vicryl and 3-0 nylon.  Solution of Marcaine, morphine and clonidine injected into the knee.  Impervious dressing was placed.  Ace wrap and knee immobilizer were place.  The patient tolerated the procedure well without immediate complications, transferred to the recovery room in stable condition.     Burnard Bunting, M.D.     GSD/MEDQ  D:  09/30/2015  T:  09/30/2015  Job:  [id number]

## 2015-09-30 NOTE — Anesthesia Procedure Notes (Addendum)
Anesthesia Regional Block:  Adductor canal block  Pre-Anesthetic Checklist: ,, timeout performed, Correct Patient, Correct Site, Correct Laterality, Correct Procedure, Correct Position, site marked, Risks and benefits discussed, pre-op evaluation,  At surgeon's request and post-op pain management  Laterality: Right  Prep: Maximum Sterile Barrier Precautions used, chloraprep       Needles:  Injection technique: Single-shot  Needle Type: Echogenic Stimulator Needle     Needle Length: 9cm 9 cm Needle Gauge: 21 and 21 G    Additional Needles:  Procedures: ultrasound guided (picture in chart) Adductor canal block Narrative:  Start time: 09/30/2015 7:05 AM End time: 09/30/2015 7:15 AM Injection made incrementally with aspirations every 5 mL. Anesthesiologist: Gaynelle Adu  Additional Notes: 2% Lidocaine skin wheel.

## 2015-09-30 NOTE — Anesthesia Procedure Notes (Signed)
Procedure Name: Intubation Date/Time: 09/30/2015 7:44 AM Performed by: Reine Just Pre-anesthesia Checklist: Patient identified, Emergency Drugs available, Suction available, Patient being monitored and Timeout performed Patient Re-evaluated:Patient Re-evaluated prior to inductionOxygen Delivery Method: Circle system utilized and Simple face mask Preoxygenation: Pre-oxygenation with 100% oxygen Intubation Type: IV induction Ventilation: Mask ventilation without difficulty and Oral airway inserted - appropriate to patient size Laryngoscope Size: Glidescope Grade View: Grade I Tube type: Oral Tube size: 7.5 mm Number of attempts: 2 Airway Equipment and Method: Patient positioned with wedge pillow,  Stylet and Video-laryngoscopy Placement Confirmation: ETT inserted through vocal cords under direct vision,  positive ETCO2 and breath sounds checked- equal and bilateral Secured at: 21 cm Tube secured with: Tape Dental Injury: Teeth and Oropharynx as per pre-operative assessment  Difficulty Due To: Difficulty was anticipated, Difficult Airway- due to anterior larynx and Difficult Airway- due to limited oral opening Future Recommendations: Recommend- induction with short-acting agent, and alternative techniques readily available Comments: Grade 2/4 view with miller 2 and Mac 3. Easily visualized with glidescope.

## 2015-09-30 NOTE — Progress Notes (Signed)
Orthopedic Tech Progress Note Patient Details:  Barbara Giles 07/02/1980 [id number]  Ortho Devices Ortho Device/Splint Location: foot roll Ortho Device/Splint Interventions: Application   Saul Fordyce 09/30/2015, 11:31 AM

## 2015-09-30 NOTE — Brief Op Note (Signed)
09/30/2015  10:21 AM  PATIENT:  Barbara Giles  35 y.o. female  PRE-OPERATIVE DIAGNOSIS:  RIGHT KNEE ANTERIOR CRUCIATE LIGAMENT TEAR, MEDIAL MENISCAL TEAR  POST-OPERATIVE DIAGNOSIS:  RIGHT KNEE ANTERIOR CRUCIATE LIGAMENT TEAR,   PROCEDURE:  Procedure(s): RIGHT RECONSTRUCTION ANTERIOR CRUCIATE LIGAMENT (ACL) WITH HAMSTRING GRAFT PARTIAL MEDIAL MENISCECTOMY  SURGEON:  Surgeon(s): Cammy Copa, MD  ASSISTANT: Patrick Jupiter rnfa  ANESTHESIA:   general  EBL: 5 ml    Total I/O In: 1000 [I.V.:1000] Out: 50 [Blood:50]  BLOOD ADMINISTERED: none  DRAINS: none   LOCAL MEDICATIONS USED:  Marcaine mso4 clonidine  SPECIMEN:  No Specimen  COUNTS:  YES  TOURNIQUET:  * No tourniquets in log *  DICTATION: .Other Dictation: Dictation Number [id number]  PLAN OF CARE: Discharge to home after PACU  PATIENT DISPOSITION:  PACU - hemodynamically stable

## 2015-09-30 NOTE — H&P (Signed)
Barbara Giles is an 35 y.o. female.   Chief Complaint: Right knee pain and instability  HPI: Barbara Giles is a 35 year old female who injured her right knee several weeks ago.  She describes symptomatic instability with the right knee.  MRI scan confirms medial meniscal tear and anterior cruciate ligament tear.  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 Bronchitis   . Hypertension   . Obesity   . Personal history of kidney stones     Past Surgical History:  Procedure Laterality Date  . TONSILLECTOMY      Family History  Problem Relation Age of Onset  . Hypertension Mother   . Hyperlipidemia Mother   . Depression Father   . Myasthenia gravis Father   . Cancer Father     pancreatic cancer  . Diabetes Father   . Hyperlipidemia Father   . Mental illness Father   . Cancer Sister     breast cancer  . Hyperlipidemia Sister   . Hypertension Sister   . Cancer Paternal Aunt     breast cancer  . Mental illness Paternal Grandmother   . Hyperlipidemia Paternal Grandfather    Social History:  reports that she has never smoked. She has never used smokeless tobacco. She reports that she drinks alcohol. She reports that she does not use drugs.  Allergies: No Known Allergies  Medications Prior to Admission  Medication Sig Dispense Refill  . benzonatate (TESSALON) 200 MG capsule Take 1 capsule (200 mg total) by mouth 2 (two) times daily as needed for cough. (Patient not taking: Reported on 09/09/2015) 20 capsule 0  . ergocalciferol (VITAMIN D2) 50000 UNITS capsule Take 1 capsule (50,000 Units total) by mouth once a week. (Patient not taking: Reported on 08/01/2015) 12 capsule 1  . Hydrocodone-Homatropine (TUSSIGON) 5-1.5 MG TABS Take 1 tablet by mouth at bedtime. (Patient not taking:  Reported on 09/09/2015) 10 each 0  . lisinopril (PRINIVIL,ZESTRIL) 10 MG tablet Take 1 tablet (10 mg total) by mouth daily. 90 tablet 0  . naproxen sodium (ANAPROX) 220 MG tablet Take 220 mg by mouth 2 (two) times daily as needed (for pain). Reported on 08/01/2015      Results for orders placed or performed during the hospital encounter of 09/29/15 (from the past 48 hour(s))  Basic metabolic panel     Status: None   Collection Time: 09/29/15  9:56 AM  Result Value Ref Range   Sodium 135 135 - 145 mmol/L   Potassium 4.2 3.5 - 5.1 mmol/L   Chloride 103 101 - 111 mmol/L   CO2 25 22 - 32 mmol/L   Glucose, Bld 93 65 - 99 mg/dL   BUN 11 6 - 20 mg/dL   Creatinine, Ser 0.62 0.44 - 1.00 mg/dL   Calcium 9.0 8.9 - 10.3 mg/dL   GFR calc non Af Amer >60 >60 mL/min   GFR calc Af Amer >60 >60 mL/min    Comment: (NOTE) The eGFR has been calculated using the CKD EPI equation. This calculation has not been validated in all clinical situations. eGFR's persistently <60 mL/min signify possible Chronic Kidney Disease.    Anion gap 7 5 - 15  CBC     Status: None   Collection Time: 09/29/15  9:56 AM  Result Value Ref Range  WBC 8.4 4.0 - 10.5 K/uL   RBC 4.63 3.87 - 5.11 MIL/uL   Hemoglobin 12.6 12.0 - 15.0 g/dL   HCT 38.8 36.0 - 46.0 %   MCV 83.8 78.0 - 100.0 fL   MCH 27.2 26.0 - 34.0 pg   MCHC 32.5 30.0 - 36.0 g/dL   RDW 13.6 11.5 - 15.5 %   Platelets 315 150 - 400 K/uL  hCG, serum, qualitative     Status: None   Collection Time: 09/29/15  9:56 AM  Result Value Ref Range   Preg, Serum NEGATIVE NEGATIVE    Comment:        THE SENSITIVITY OF THIS METHODOLOGY IS >10 mIU/mL.    No results found.  Review of Systems  Musculoskeletal: Positive for joint pain.  All other systems reviewed and are negative.   Blood pressure 132/72, pulse 94, temperature 98.4 F (36.9 C), temperature source Oral, height 5' (1.524 m), weight 113.9 kg (251 lb), last menstrual period 09/15/2015, SpO2 97 %. Physical  Exam  Constitutional: She appears well-developed.  HENT:  Head: Normocephalic.  Eyes: Pupils are equal, round, and reactive to light.  Neck: Normal range of motion.  Cardiovascular: Normal rate.   Respiratory: Effort normal.  Neurological: She is alert.  Skin: Skin is warm.  Psychiatric: She has a normal mood and affect.   examination the right knee demonstrates full extension and flexion easily past 90 collaterals are stable at 0 and 30 to varus and valgus stress PCL intact no posterior lateral rotatory instability patient has good ankle dorsiflexion and plantarflexion strength pedal pulses palpable Lachman positive for anterior cruciate ligament discontinuity  Assessment/Plan Impression is right knee anterior cruciate ligament tear with medial meniscal tear in a patient who is having symptomatic instability following an accident.  Plan anterior cruciate ligament reconstruction hamstring autograft and partial medial meniscectomy with postop CPM gray foot cradle boot to work on extension along with some raw also for DVT prophylaxis risk benefits of the surgery discussed with the patient including not limited to infection or vessel damage knee stiffness prolonged rehabilitation all QUESTIONS answered  Meredith Pel, MD 09/30/2015, 7:06 AM

## 2015-09-30 NOTE — Anesthesia Postprocedure Evaluation (Signed)
Anesthesia Post Note  Patient: Barbara Giles  Procedure(s) Performed: Procedure(s) (LRB): RIGHT RECONSTRUCTION ANTERIOR CRUCIATE LIGAMENT (ACL) WITH HAMSTRING GRAFT (Right) PARTIAL MEDIAL MENISCECTOMY (Right)  Patient location during evaluation: PACU Anesthesia Type: General and Regional Level of consciousness: awake and alert Pain management: pain level controlled Vital Signs Assessment: post-procedure vital signs reviewed and stable Respiratory status: spontaneous breathing, nonlabored ventilation and respiratory function stable Cardiovascular status: blood pressure returned to baseline and stable Postop Assessment: no signs of nausea or vomiting Anesthetic complications: no    Last Vitals:  Vitals:   09/30/15 1151 09/30/15 1152  BP: 114/75   Pulse: (!) 107 92  Resp: 19 18  Temp:  36.6 C    Last Pain:  Vitals:   09/30/15 1130  TempSrc:   PainSc: 5                  Loraine Freid,W. EDMOND

## 2015-09-30 NOTE — Anesthesia Procedure Notes (Signed)
Anesthesia Procedure Image    

## 2015-09-30 NOTE — Discharge Instructions (Signed)
Apply and maintain zero knee (bone foam) while sitting or Lying.

## 2015-10-03 ENCOUNTER — Encounter (HOSPITAL_COMMUNITY): Payer: Self-pay | Admitting: Orthopedic Surgery

## 2015-11-05 DIAGNOSIS — I10 Essential (primary) hypertension: Secondary | ICD-10-CM | POA: Insufficient documentation

## 2015-11-05 NOTE — Progress Notes (Deleted)
   Subjective:    Patient ID: Barbara Giles, female    DOB: 08/30/1980, 35 y.o.   MRN: [id number]  HPI  Barbara Giles is a delightful 35 yo woman here to follow-up on her chronic medical conditions and for medication refills.  Last mo she injured her Right knee and had to undergo surgical ACL repair.  HTN:  Started hctz 25mg  about 1 year prior, then added in triamterene 6 mos prior.  Does have cuff at home to check BP Obesity: nml a1c 6 mos prior Vit D def:  Had finally increased up to 30 when last checked 6 mos prior.  Taking supp still? ASCUS pap with neg HR HPV on last pap on 05/03/14 so due for repeat around 04/2017  Review of Systems     Objective:   Physical Exam        Assessment & Plan:  Normal bmp and cbc 1 mo prior. Cont vit D supp.

## 2015-11-06 ENCOUNTER — Ambulatory Visit: Payer: Managed Care, Other (non HMO) | Admitting: Family Medicine

## 2015-12-20 IMAGING — MG MM SCREEN MAMMOGRAM BILATERAL
5 series · 5 of 5 positions shown · non-contrast
Comparison: None.

ACR Breast Density Category a: The breast tissue is almost entirely
fatty.

CLINICAL DATA: Screening.

EXAM:
DIGITAL SCREENING BILATERAL MAMMOGRAM WITH CAD

[R CC]
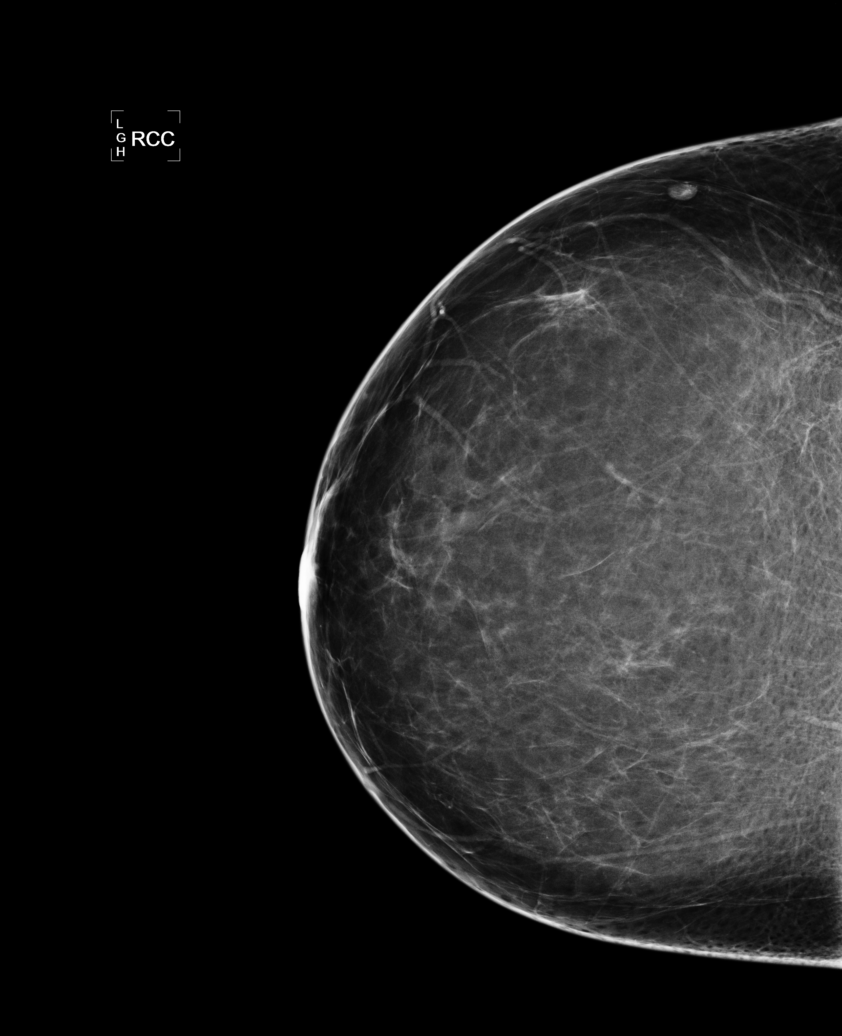

[L CC]
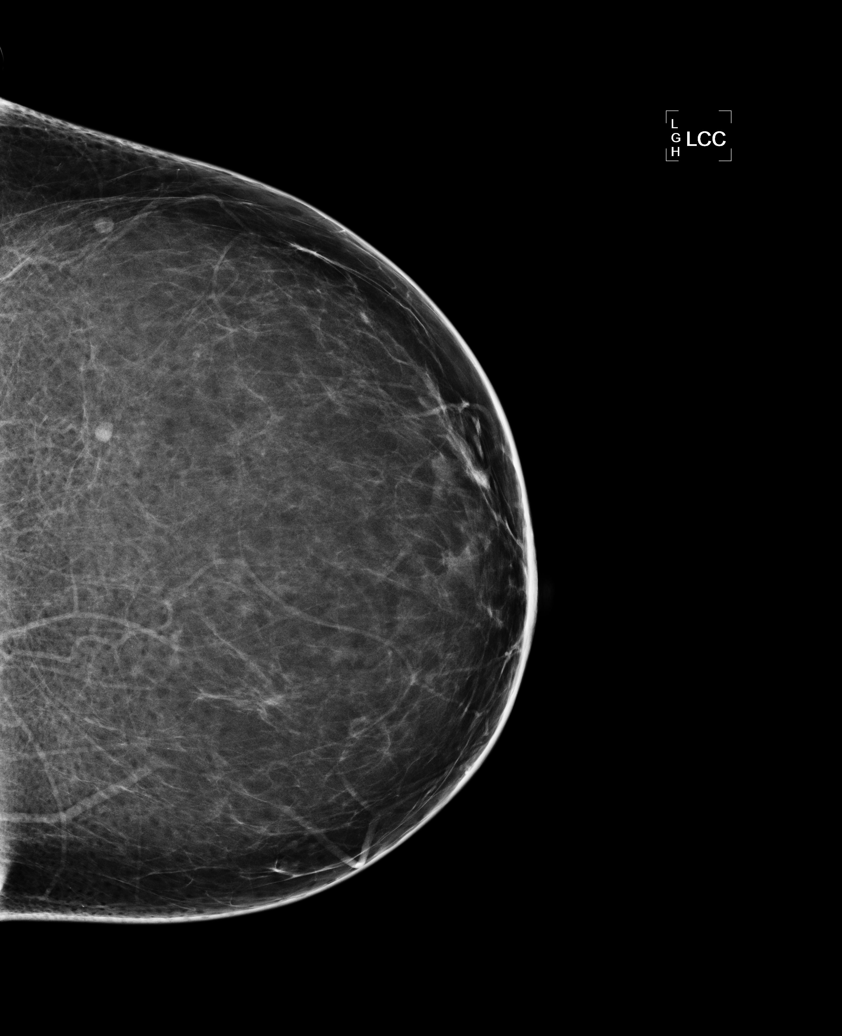

[L MLO (1 of 2)]
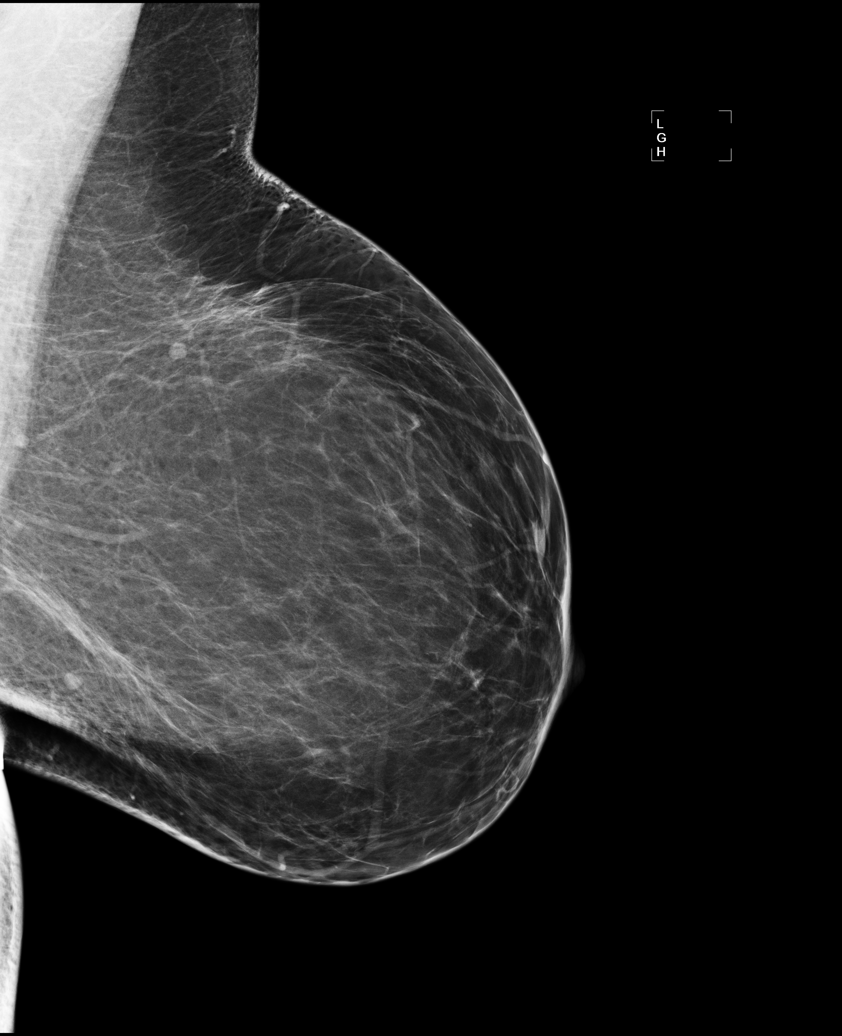

[R MLO]
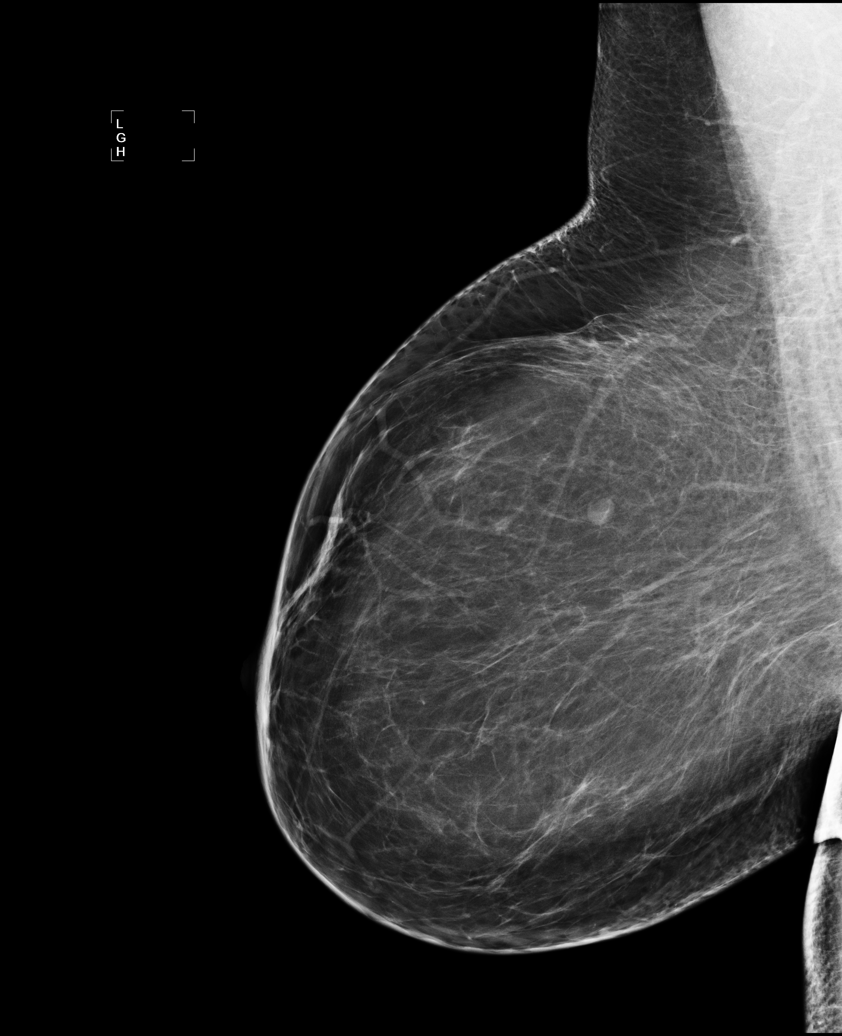

[L MLO (2 of 2)]
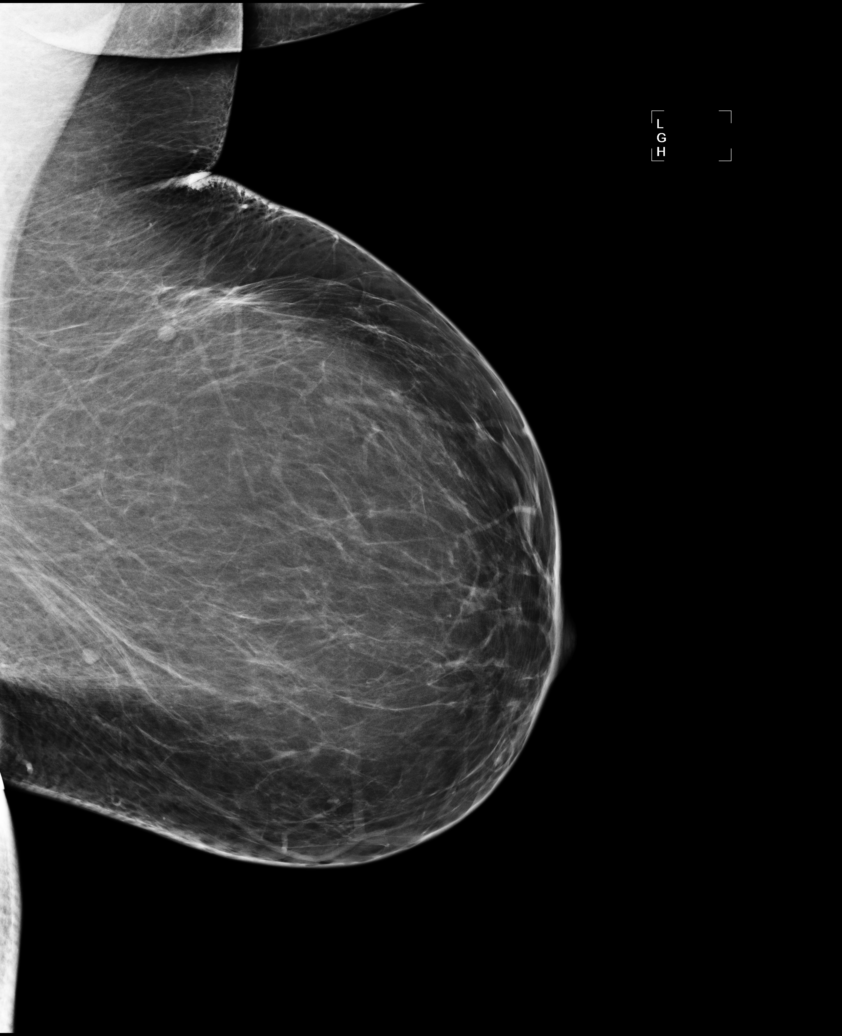

[5 of 5 positions shown; findings below may reference images not displayed]

FINDINGS: There are no findings suspicious for malignancy. Images were
processed with CAD.
IMPRESSION: No mammographic evidence of malignancy. A result letter of this
screening mammogram will be mailed directly to the patient.

RECOMMENDATION:
Screening mammogram at age 38. (Code:UL-X-U8W)

BI-RADS CATEGORY  1: Negative.

## 2016-01-09 ENCOUNTER — Ambulatory Visit (INDEPENDENT_AMBULATORY_CARE_PROVIDER_SITE_OTHER): Payer: Managed Care, Other (non HMO) | Admitting: Orthopedic Surgery

## 2016-03-17 NOTE — Progress Notes (Signed)
Subjective:    Patient ID: Barbara Giles, female    DOB: 12-08-1980, 36 y.o.   MRN: [id number] Chief Complaint  Patient presents with  . Follow-up    BLOOD PRESSURE    HPI  Barbara Giles is a 36 yo woman here today to follow-up on her HTN and refill of her chronic medications.  I have not seen pt in 15 months.  HTN: Started on meds 2016. Was on maxzide 37.5-25 and lisinopril 10 but no longer on maxzide?Marland Kitchen Monitors BP outside of the office about once a week.  She ran out of lisinopril sev wks ago and yesterday she decided to check her BP several times and as 148/100, 153/90, 148/90. Maxzide was stopped by a misunderstanding - she thought she was supposed to switch.  On lisinopril 10mg  her BP is 105-120s/70-80s. Pt is abstinent. Obesity: hgba1c 5.4 05/2015 Vitamin D def: normal at 30 05/2015 ASCUS pap with neg HR HPV 04/2014 - no prior h/o abnml so ok to wait 3 yrs before repeat pap and HPV but ok to repeat cytology only after 04/2015 if pt prefers Periods and bowels normal,  Urine normal.    She underwent Right ACL repair 09/2015  Works doing Audiological scientist at [employer].  FHx of DVT on father's side. Pt's mother is Barbara Giles who is a pt of Dr. Katrinka Blazing.  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 Katrinka Blazing and Dr. Dione Booze.  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  Pt has started dating.  She has a teenage daughter and is not interested in any further children. Has not been on birth control prior but is willing to start.  Past Medical History:  Diagnosis Date  . Bronchitis   . Hypertension   . Obesity   . Personal history of kidney stones    Past Surgical History:  Procedure Laterality Date  . ANTERIOR CRUCIATE LIGAMENT REPAIR Right 09/30/2015   Procedure: RIGHT RECONSTRUCTION ANTERIOR CRUCIATE LIGAMENT (ACL) WITH HAMSTRING GRAFT;  Surgeon: Cammy Copa, MD;  Location: [facility] OR;  Service: Orthopedics;  Laterality: Right;  . MENISECTOMY Right 09/30/2015   Procedure: PARTIAL MEDIAL MENISCECTOMY;  Surgeon: Cammy Copa, MD;  Location: [facility] OR;  Service: Orthopedics;  Laterality: Right;  . TONSILLECTOMY     No current outpatient prescriptions on file prior to visit.   No current facility-administered medications on file prior to visit.    No Known Allergies Family History  Problem Relation Age of Onset  . Hypertension Mother   . Hyperlipidemia Mother   . Depression Father   . Myasthenia gravis Father   . Cancer Father     pancreatic cancer  . Diabetes Father   . Hyperlipidemia Father   . Mental illness Father   . Cancer Sister     breast cancer  . Hyperlipidemia Sister   . Hypertension Sister   . Cancer Paternal Aunt     breast cancer  . Mental illness Paternal Grandmother   . Hyperlipidemia Paternal Grandfather    Social History   Social History  . Marital status: Single    Spouse name: N/A  . Number of children: N/A  . Years of education: N/A   Occupational History  .      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 Smoking status: Never Smoker  . Smokeless tobacco: Never Used  . Alcohol use Yes     Comment: rarely  . Drug use: No  .  Sexual activity: Yes   Other Topics Concern  . None   Social History Narrative  . None   Depression screen [facility] 2/9 03/18/2016 08/01/2015 05/15/2015 12/26/2014 04/26/2014  Decreased Interest 0 0 0 0 0  Down, Depressed, Hopeless 0 0 0 0 0  PHQ - 2 Score 0 0 0 0 0    Review of Systems See hpi    Objective:   Physical Exam  Constitutional: She is oriented to person, place, and time. She appears well-developed and well-nourished. No distress.  HENT:  Head: Normocephalic and atraumatic.  Right Ear: External ear normal.  Left Ear: External ear normal.  Eyes: Conjunctivae are normal. No scleral icterus.  Neck: Normal range of motion. Neck supple. No thyromegaly present.  Cardiovascular: Normal rate, regular rhythm, normal heart sounds and intact distal pulses.     Pulmonary/Chest: Effort normal and breath sounds normal. No respiratory distress.  Musculoskeletal: She exhibits no edema.  Lymphadenopathy:    She has no cervical adenopathy.  Neurological: She is alert and oriented to person, place, and time.  Skin: Skin is warm and dry. She is not diaphoretic. No erythema.  Psychiatric: She has a normal mood and affect. Her behavior is normal.      BP 134/74 (BP Location: Right Arm, Patient Position: Sitting, Cuff Size: Large)   Pulse 72   Temp 98.2 F (36.8 C) (Oral)   Resp 18   Ht 5' (1.524 m)   Wt 252 lb (114.3 kg)   LMP 03/18/2016   SpO2 98%   BMI 49.22 kg/m   Assessment & Plan:   Sched CPE within the next year. Needs repeat pap by 04/2017. Bmp nml 6 mos prior - pt declines repeat today but will do at f/u. 1. Essential hypertension   2. Medication monitoring encounter   3.      Family planning - start on OCPs since on lisinopril - Aware of the teratogenic risks of lisinopril, pt is abstinent and is aware how important it is to stop lisinopril before initiating any sexual activity. If migraines worsen at all, stop immed and RTC. Recheck BP in 1-2 mos after startingl  Meds ordered this encounter  Medications  . norgestimate-ethinyl estradiol (ORTHO-CYCLEN,SPRINTEC,PREVIFEM) 0.25-35 MG-MCG tablet    Sig: Take 1 tablet by mouth daily.    Dispense:  3 Package    Refill:  4  . lisinopril (PRINIVIL,ZESTRIL) 10 MG tablet    Sig: Take 1 tablet (10 mg total) by mouth daily.    Dispense:  90 tablet    Refill:  3    Norberto SorensonEva Draden Cottingham, M.D.  [facility]  [place] [address] [place], [place] [address] [phone] phone [phone] fax  04/10/16 10:11 PM

## 2016-03-18 ENCOUNTER — Encounter: Payer: Self-pay | Admitting: Family Medicine

## 2016-03-18 ENCOUNTER — Ambulatory Visit (INDEPENDENT_AMBULATORY_CARE_PROVIDER_SITE_OTHER): Payer: BLUE CROSS/BLUE SHIELD | Admitting: Family Medicine

## 2016-03-18 VITALS — BP 134/74 | HR 72 | Temp 98.2°F | Resp 18 | Ht 60.0 in | Wt 252.0 lb

## 2016-03-18 DIAGNOSIS — I10 Essential (primary) hypertension: Secondary | ICD-10-CM | POA: Diagnosis not present

## 2016-03-18 DIAGNOSIS — Z5181 Encounter for therapeutic drug level monitoring: Secondary | ICD-10-CM | POA: Diagnosis not present

## 2016-03-18 DIAGNOSIS — Z3009 Encounter for other general counseling and advice on contraception: Secondary | ICD-10-CM

## 2016-03-18 MED ORDER — LISINOPRIL 10 MG PO TABS: 10.0000 mg | ORAL_TABLET | Freq: Every day | ORAL | 3 refills | Status: DC

## 2016-03-18 MED ORDER — NORGESTIMATE-ETH ESTRADIOL 0.25-35 MG-MCG PO TABS: 1.0000 | ORAL_TABLET | Freq: Every day | ORAL | 4 refills | Status: DC

## 2016-03-18 NOTE — Patient Instructions (Addendum)
   IF you received an x-ray today, you will receive an invoice from [facility]. Please contact [facility] at [phone] with questions or concerns regarding your invoice.   IF you received labwork today, you will receive an invoice from [facility]. Please contact [facility] at [phone] with questions or concerns regarding your invoice.   Our billing staff will not be able to assist you with questions regarding bills from these companies.  You will be contacted with the lab results as soon as they are available. The fastest way to get your results is to activate your My Chart account. Instructions are located on the last page of this paperwork. If you have not heard from us regarding the results in 2 weeks, please contact this office.     Oral Contraception Use Oral contraceptive pills (OCPs) are medicines taken to prevent pregnancy. OCPs work by preventing the ovaries from releasing eggs. The hormones in OCPs also cause the cervical mucus to thicken, preventing the sperm from entering the uterus. The hormones also cause the uterine lining to become thin, not allowing a fertilized egg to attach to the inside of the uterus. OCPs are highly effective when taken exactly as prescribed. However, OCPs do not prevent sexually transmitted diseases (STDs). Safe sex practices, such as using condoms along with an OCP, can help prevent STDs. Before taking OCPs, you may have a physical exam and Pap test. Your health care provider may also order blood tests if necessary. Your health care provider will make sure you are a good candidate for oral contraception. Discuss with your health care provider the possible side effects of the OCP you may be prescribed. When starting an OCP, it can take 2 to 3 months for the body to adjust to the changes in hormone levels in your body. How to take oral contraceptive pills Your health care provider may advise you on how to start taking the first cycle of  OCPs. Otherwise, you can:  Start on day 1 of your menstrual period. You will not need any backup contraceptive protection with this start time.  Start on the first Sunday after your menstrual period or the day you get your prescription. In these cases, you will need to use backup contraceptive protection for the first week.  Start the pill at any time of your cycle. If you take the pill within 5 days of the start of your period, you are protected against pregnancy right away. In this case, you will not need a backup form of birth control. If you start at any other time of your menstrual cycle, you will need to use another form of birth control for 7 days. If your OCP is the type called a minipill, it will protect you from pregnancy after taking it for 2 days (48 hours). After you have started taking OCPs:  If you forget to take 1 pill, take it as soon as you remember. Take the next pill at the regular time.  If you miss 2 or more pills, call your health care provider because different pills have different instructions for missed doses. Use backup birth control until your next menstrual period starts.  If you use a 28-day pack that contains inactive pills and you miss 1 of the last 7 pills (pills with no hormones), it will not matter. Throw away the rest of the non-hormone pills and start a new pill pack. No matter which day you start the OCP, you will always start a new pack   on that same day of the week. Have an extra pack of OCPs and a backup contraceptive method available in case you miss some pills or lose your OCP pack. Follow these instructions at home:  Do not smoke.  Always use a condom to protect against STDs. OCPs do not protect against STDs.  Use a calendar to mark your menstrual period days.  Read the information and directions that came with your OCP. Talk to your health care provider if you have questions. Contact a health care provider if:  You develop nausea and  vomiting.  You have abnormal vaginal discharge or bleeding.  You develop a rash.  You miss your menstrual period.  You are losing your hair.  You need treatment for mood swings or depression.  You get dizzy when taking the OCP.  You develop acne from taking the OCP.  You become pregnant. Get help right away if:  You develop chest pain.  You develop shortness of breath.  You have an uncontrolled or severe headache.  You develop numbness or slurred speech.  You develop visual problems.  You develop pain, redness, and swelling in the legs. This information is not intended to replace advice given to you by your health care provider. Make sure you discuss any questions you have with your health care provider. Document Released: 02/11/2011 Document Revised: 07/31/2015 Document Reviewed: 08/13/2012 Elsevier Interactive Patient Education  2017 Elsevier Inc.  Oral Contraception Information Oral contraceptive pills (OCPs) are medicines taken to prevent pregnancy. OCPs work by preventing the ovaries from releasing eggs. The hormones in OCPs also cause the cervical mucus to thicken, preventing the sperm from entering the uterus. The hormones also cause the uterine lining to become thin, not allowing a fertilized egg to attach to the inside of the uterus. OCPs are highly effective when taken exactly as prescribed. However, OCPs do not prevent sexually transmitted diseases (STDs). Safe sex practices, such as using condoms along with the pill, can help prevent STDs.  Before taking the pill, you may have a physical exam and Pap test. Your health care provider may order blood tests. The health care provider will make sure you are a good candidate for oral contraception. Discuss with your health care provider the possible side effects of the OCP you may be prescribed. When starting an OCP, it can take 2 to 3 months for the body to adjust to the changes in hormone levels in your body.  TYPES OF  ORAL CONTRACEPTION  The combination pill-This pill contains estrogen and progestin (synthetic progesterone) hormones. The combination pill comes in 21-day, 28-day, or 91-day packs. Some types of combination pills are meant to be taken continuously (365-day pills). With 21-day packs, you do not take pills for 7 days after the last pill. With 28-day packs, the pill is taken every day. The last 7 pills are without hormones. Certain types of pills have more than 21 hormone-containing pills. With 91-day packs, the first 84 pills contain both hormones, and the last 7 pills contain no hormones or contain estrogen only.  The minipill-This pill contains the progesterone hormone only. The pill is taken every day continuously. It is very important to take the pill at the same time each day. The minipill comes in packs of 28 pills. All 28 pills contain the hormone.  ADVANTAGES OF ORAL CONTRACEPTIVE PILLS  Decreases premenstrual symptoms.   Treats menstrual period cramps.   Regulates the menstrual cycle.   Decreases a heavy menstrual flow.   May   treatacne, depending on the type of pill.   Treats abnormal uterine bleeding.   Treats polycystic ovarian syndrome.   Treats endometriosis.   Can be used as emergency contraception.  THINGS THAT CAN MAKE ORAL CONTRACEPTIVE PILLS LESS EFFECTIVE OCPs can be less effective if:   You forget to take the pill at the same time every day.   You have a stomach or intestinal disease that lessens the absorption of the pill.   You take OCPs with other medicines that make OCPs less effective, such as antibiotics, certain HIV medicines, and some seizure medicines.   You take expired OCPs.   You forget to restart the pill on day 7, when using the packs of 21 pills.  RISKS ASSOCIATED WITH ORAL CONTRACEPTIVE PILLS  Oral contraceptive pills can sometimes cause side effects, such as:  Headache.  Nausea.  Breast tenderness.  Irregular bleeding or  spotting. Combination pills are also associated with a small increased risk of:  Blood clots.  Heart attack.  Stroke. This information is not intended to replace advice given to you by your health care provider. Make sure you discuss any questions you have with your health care provider. Document Released: 05/15/2002 Document Revised: 06/16/2015 Document Reviewed: 08/13/2012 Elsevier Interactive Patient Education  2017 Elsevier Inc.   

## 2016-03-29 ENCOUNTER — Telehealth: Payer: Self-pay

## 2016-03-29 NOTE — Telephone Encounter (Signed)
Patient is calling to let Clelia CroftShaw know that she is having side-effects from her medications.  She states that she has been having nausea and gas pains since she has started her medications.  She is unsure as to which medication is causing these symptoms.  Please advise  [phone]

## 2016-03-31 MED ORDER — LEVONORGESTREL-ETHINYL ESTRAD 0.15-30 MG-MCG PO TABS: 1.0000 | ORAL_TABLET | Freq: Every day | ORAL | 11 refills | Status: DC

## 2016-03-31 NOTE — Telephone Encounter (Signed)
Likely from the OCPs - often the symptoms will go away as you stay on the pill but if it is bothersome to her I have sent a lower dose pill into her pharmacy that she can switch to. I would recommend just waiting to start the new pill pack  Until 28 days after she initially started the last

## 2016-04-03 NOTE — Telephone Encounter (Signed)
Patient states she has already d/c the OCP. She will get the new dose, and start them as directed. Thank you. To you FYI

## 2016-05-06 ENCOUNTER — Encounter: Payer: BLUE CROSS/BLUE SHIELD | Admitting: Family Medicine

## 2016-05-27 ENCOUNTER — Encounter: Payer: BLUE CROSS/BLUE SHIELD | Admitting: Family Medicine

## 2017-02-15 ENCOUNTER — Other Ambulatory Visit: Payer: Self-pay | Admitting: Family Medicine

## 2017-08-31 ENCOUNTER — Other Ambulatory Visit: Payer: Self-pay | Admitting: Family Medicine

## 2017-09-02 ENCOUNTER — Telehealth: Payer: Self-pay | Admitting: Family Medicine

## 2017-09-02 MED ORDER — LISINOPRIL 10 MG PO TABS: 10.0000 mg | ORAL_TABLET | Freq: Every day | ORAL | 0 refills | Status: DC

## 2017-09-02 NOTE — Telephone Encounter (Signed)
Copied from CRM [phone] [id number]. Topic: General - Other >> Sep 02, 2017  9:24 AM Gerrianne ScalePayne, Angela L wrote: Reason for CRM: pt calling stating that she is going out of town on Sunday and waiting on the RX for lisinopril to be signed she faxed over the request 4-5 days ago she works at the [employer] [address] - [place], [place] - [address]

## 2017-09-02 NOTE — Telephone Encounter (Signed)
Prescription sent.  Need office visit before this refill runs out.

## 2017-09-23 ENCOUNTER — Ambulatory Visit (HOSPITAL_COMMUNITY)
Admission: EM | Admit: 2017-09-23 | Discharge: 2017-09-23 | Disposition: A | Discharge status: Home / Self Care | Payer: BLUE CROSS/BLUE SHIELD | Attending: Internal Medicine | Admitting: Internal Medicine

## 2017-09-23 ENCOUNTER — Encounter (HOSPITAL_COMMUNITY): Payer: Self-pay

## 2017-09-23 DIAGNOSIS — M79672 Pain in left foot: Secondary | ICD-10-CM | POA: Diagnosis not present

## 2017-09-23 MED ORDER — PREDNISONE 50 MG PO TABS: 50.0000 mg | ORAL_TABLET | Freq: Every day | ORAL | 0 refills | Status: AC

## 2017-09-23 NOTE — Discharge Instructions (Signed)
Given no injury/trauma, symptoms most likely due to inflammation. Start prednisone as directed. Continue ice compress, elevation. Use the ice water bottle to roll under and stretch out the fascia. This may take a few weeks to completely resolve, but should be feeling better each week. Follow up with PCP for further evaluation if symptoms not improving.

## 2017-09-23 NOTE — ED Triage Notes (Signed)
Pt presents with left foot pain.

## 2017-09-23 NOTE — ED Provider Notes (Signed)
[facility]    CSN: [id number] Arrival date & time: 09/23/17  1106     History   Chief Complaint Chief Complaint  Patient presents with  . Foot Pain    left    HPI Barbara Giles is a 37 y.o. female.   37 year old female comes in for 1 week history of left foot pain. Denies injury/trauma. States does have history of plantar fascitis, but current pain has been worse and more constant. States pain is present at rest, and worse with movement and weight bearing. She denies numbness/tingling. Has continued icing and naproxen in the morning without relief.      Past Medical History:  Diagnosis Date  . Bronchitis   . Hypertension   . Obesity   . Personal history of kidney stones     Patient Active Problem List   Diagnosis Date Noted  . Hypertension 11/05/2015  . 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 Vitamin D deficiency 04/27/2014    Past Surgical History:  Procedure Laterality Date  . ANTERIOR CRUCIATE LIGAMENT REPAIR Right 09/30/2015   Procedure: RIGHT RECONSTRUCTION ANTERIOR CRUCIATE LIGAMENT (ACL) WITH HAMSTRING GRAFT;  Surgeon: Cammy Copa, MD;  Location: [facility] OR;  Service: Orthopedics;  Laterality: Right;  . MENISECTOMY Right 09/30/2015   Procedure: PARTIAL MEDIAL MENISCECTOMY;  Surgeon: Cammy Copa, MD;  Location: [facility] OR;  Service: Orthopedics;  Laterality: Right;  . TONSILLECTOMY      OB History    Gravida  1   Para  1   Term      Preterm      AB      Living        SAB      TAB      Ectopic      Multiple      Live Births               Home Medications    Prior to Admission medications   Medication Sig Start Date End Date Taking? Authorizing Provider  levonorgestrel-ethinyl estradiol (NORDETTE) 0.15-30 MG-MCG tablet Take 1 tablet by mouth daily. 03/31/16   Sherren Mocha, MD  lisinopril (PRINIVIL,ZESTRIL) 10 MG tablet Take 1 tablet (10 mg total)  by mouth daily. Needs office visit for additional refills 09/02/17   Sherren Mocha, MD  predniSONE (DELTASONE) 50 MG tablet Take 1 tablet (50 mg total) by mouth daily for 5 days. 09/23/17 09/28/17  Belinda Fisher, PA-C    Family History Family History  Problem Relation Age of Onset  . Hypertension Mother   . Hyperlipidemia Mother   . Depression Father   . Myasthenia gravis Father   . Cancer Father        pancreatic cancer  . Diabetes Father   . Hyperlipidemia Father   . Mental illness Father   . Cancer Sister        breast cancer  . Hyperlipidemia Sister   . Hypertension Sister   . Cancer Paternal Aunt        breast cancer  . Mental illness Paternal Grandmother   . Hyperlipidemia Paternal Grandfather     Social History Social History   Tobacco Use  . Smoking status: Never Smoker  . Smokeless tobacco: Never Used  Substance Use Topics  . Alcohol use: Yes    Comment: rarely  . Drug use: No     Allergies   Patient has no known  allergies.   Review of Systems Review of Systems  Reason unable to perform ROS: See HPI as above.     Physical Exam Triage Vital Signs ED Triage Vitals  Enc Vitals Group     BP 09/23/17 1117 120/79     Pulse Rate 09/23/17 1117 67     Resp 09/23/17 1117 20     Temp 09/23/17 1117 98.6 F (37 C)     Temp Source 09/23/17 1117 Temporal     SpO2 09/23/17 1117 98 %     Weight --      Height --      Head Circumference --      Peak Flow --      Pain Score 09/23/17 1118 10     Pain Loc --      Pain Edu? --      Excl. in GC? --    No data found.  Updated Vital Signs BP 120/79 (BP Location: Left Arm)   Pulse 67   Temp 98.6 F (37 C) (Temporal)   Resp 20   LMP 09/19/2017   SpO2 98%   Physical Exam  Constitutional: She is oriented to person, place, and time. She appears well-developed and well-nourished. No distress.  HENT:  Head: Normocephalic and atraumatic.  Eyes: Pupils are equal, round, and reactive to light. Conjunctivae are normal.   Musculoskeletal:  No swelling, erythema, increased warmth, contusion. Tenderness to palpation of plantar heel. Full ROM of ankles and toes. Strength normal and equal bilaterally.  Sensation intact and equal bilaterally.  Pedal pulse 2+ and equal bilaterally.  Neurological: She is alert and oriented to person, place, and time.  Skin: She is not diaphoretic.     UC Treatments / Results  Labs (all labs ordered are listed, but only abnormal results are displayed) Labs Reviewed - No data to display  EKG None  Radiology No results found.  Procedures Procedures (including critical care time)  Medications Ordered in UC Medications - No data to display  Initial Impression / Assessment and Plan / UC Course  I have reviewed the triage vital signs and the nursing notes.  Pertinent labs & imaging results that were available during my care of the patient were reviewed by me and considered in my medical decision making (see chart for details).    Start prednisone as directed.  Continue ice compress, elevation.  Return precautions given.  Patient expresses understanding and agrees to plan.   Final Clinical Impressions(s) / UC Diagnoses   Final diagnoses:  Foot pain, left    ED Prescriptions    Medication Sig Dispense Auth. Provider   predniSONE (DELTASONE) 50 MG tablet Take 1 tablet (50 mg total) by mouth daily for 5 days. 5 tablet Threasa AlphaYu, Amy V, PA-C        Yu, Amy V, [place]-C 09/23/17 1236

## 2017-09-30 ENCOUNTER — Other Ambulatory Visit: Payer: Self-pay

## 2017-09-30 ENCOUNTER — Ambulatory Visit: Payer: BLUE CROSS/BLUE SHIELD | Admitting: Urgent Care

## 2017-09-30 ENCOUNTER — Ambulatory Visit (INDEPENDENT_AMBULATORY_CARE_PROVIDER_SITE_OTHER): Payer: BLUE CROSS/BLUE SHIELD

## 2017-09-30 ENCOUNTER — Encounter: Payer: Self-pay | Admitting: Urgent Care

## 2017-09-30 VITALS — BP 119/70 | HR 93 | Temp 98.5°F | Resp 16 | Ht 60.5 in | Wt 258.2 lb

## 2017-09-30 DIAGNOSIS — M722 Plantar fascial fibromatosis: Secondary | ICD-10-CM

## 2017-09-30 DIAGNOSIS — M79672 Pain in left foot: Secondary | ICD-10-CM

## 2017-09-30 MED ORDER — PREDNISONE 20 MG PO TABS
ORAL_TABLET | ORAL | 0 refills | Status: DC
Start: 2017-09-30 — End: 2017-11-03

## 2017-09-30 NOTE — Progress Notes (Signed)
   MRN: [id number] DOB: 08/17/1980  Subjective:   Barbara Giles is a 37 y.o. female presenting for 2 week history of persistent left heel pain.  Patient reports that she stands for long hours during her shift.  She has developed pain on the underside of her heel.  She went to an urgent care and received a steroid course which helped initially as she took 3 days off in addition to this.  However, since she is been back at work and finish the steroid course her pain has returned and is quite severe to patient.  Denies fever, trauma, swelling, redness.  She reports that she is using Dr. Margart SicklesScholl's heel gel pads.  She has also tried naproxen intermittently with some relief.  Barbara Giles has a current medication list which includes the following prescription(s): lisinopril and levonorgestrel-ethinyl estradiol. Also has No Known Allergies.  Barbara Giles  has a past medical history of Bronchitis, Hypertension, Obesity, and Personal history of kidney stones. Also  has a past surgical history that includes Tonsillectomy; Anterior cruciate ligament repair (Right, 09/30/2015); and Menisectomy (Right, 09/30/2015).  Objective:   Vitals: BP 119/70   Pulse 93   Temp 98.5 F (36.9 C)   Resp 16   Ht 5' 0.5" (1.537 m)   Wt 258 lb 3.2 oz (117.1 kg)   LMP 09/19/2017   SpO2 98%   BMI 49.60 kg/m   Physical Exam  Constitutional: She is oriented to person, place, and time. She appears well-developed and well-nourished.  Cardiovascular: Normal rate.  Pulmonary/Chest: Effort normal.  Musculoskeletal:       Left foot: There is tenderness (over area outlined with toes in dorsiflexion). There is normal range of motion, no bony tenderness, no swelling, normal capillary refill, no crepitus and no deformity.       Feet:  Neurological: She is alert and oriented to person, place, and time.  Skin: Skin is warm and dry.  Psychiatric: She has a normal mood and affect.   Assessment and Plan :   Plantar fasciitis of left foot -  Plan: Ambulatory referral to Orthopedic Surgery, DG Foot Complete Left  Pain of left heel - Plan: Ambulatory referral to Orthopedic Surgery, DG Foot Complete Left  Patient would like to try another steroid course.  I am agreeable to this however I emphasized to patient that she really needs to modify her activities at work.  Therefore I have written for work restrictions.  We will also refer to orthopedics for consult.  Radiology report pending.  We will follow-up with patient by phone with these results. Counseled patient on potential for adverse effects with medications prescribed today, patient verbalized understanding.   Wallis BambergMario Kahron Kauth, PA-C [facility] [phone] 09/30/2017  5:07 PM

## 2017-09-30 NOTE — Patient Instructions (Addendum)
Plantar Fasciitis Plantar fasciitis is a painful foot condition that affects the heel. It occurs when the band of tissue that connects the toes to the heel bone (plantar fascia) becomes irritated. This can happen after exercising too much or doing other repetitive activities (overuse injury). The pain from plantar fasciitis can range from mild irritation to severe pain that makes it difficult for you to walk or move. The pain is usually worse in the morning or after you have been sitting or lying down for a while. What are the causes? This condition may be caused by:  Standing for long periods of time.  Wearing shoes that do not fit.  Doing high-impact activities, including running, aerobics, and ballet.  Being overweight.  Having an abnormal way of walking (gait).  Having tight calf muscles.  Having high arches in your feet.  Starting a new athletic activity.  What are the signs or symptoms? The main symptom of this condition is heel pain. Other symptoms include:  Pain that gets worse after activity or exercise.  Pain that is worse in the morning or after resting.  Pain that goes away after you walk for a few minutes.  How is this diagnosed? This condition may be diagnosed based on your signs and symptoms. Your health care provider will also do a physical exam to check for:  A tender area on the bottom of your foot.  A high arch in your foot.  Pain when you move your foot.  Difficulty moving your foot.  You may also need to have imaging studies to confirm the diagnosis. These can include:  X-rays.  Ultrasound.  MRI.  How is this treated? Treatment for plantar fasciitis depends on the severity of the condition. Your treatment may include:  Rest, ice, and over-the-counter pain medicines to manage your pain.  Exercises to stretch your calves and your plantar fascia.  A splint that holds your foot in a stretched, upward position while you sleep (night  splint).  Physical therapy to relieve symptoms and prevent problems in the future.  Cortisone injections to relieve severe pain.  Extracorporeal shock wave therapy (ESWT) to stimulate damaged plantar fascia with electrical impulses. It is often used as a last resort before surgery.  Surgery, if other treatments have not worked after 12 months.  Follow these instructions at home:  Take medicines only as directed by your health care provider.  Avoid activities that cause pain.  Roll the bottom of your foot over a bag of ice or a bottle of cold water. Do this for 20 minutes, 3-4 times a day.  Perform simple stretches as directed by your health care provider.  Try wearing athletic shoes with air-sole or gel-sole cushions or soft shoe inserts.  Wear a night splint while sleeping, if directed by your health care provider.  Keep all follow-up appointments with your health care provider. How is this prevented?  Do not perform exercises or activities that cause heel pain.  Consider finding low-impact activities if you continue to have problems.  Lose weight if you need to. The best way to prevent plantar fasciitis is to avoid the activities that aggravate your plantar fascia. Contact a health care provider if:  Your symptoms do not go away after treatment with home care measures.  Your pain gets worse.  Your pain affects your ability to move or do your daily activities. This information is not intended to replace advice given to you by your health care provider. Make sure you   discuss any questions you have with your health care provider. Document Released: 11/17/2000 Document Revised: 07/28/2015 Document Reviewed: 01/02/2014 Elsevier Interactive Patient Education  2018 Elsevier Inc.     IF you received an x-ray today, you will receive an invoice from [facility]. Please contact [facility] at [phone] with questions or concerns regarding your invoice.    IF you received labwork today, you will receive an invoice from [facility]. Please contact [facility] at [phone] with questions or concerns regarding your invoice.   Our billing staff will not be able to assist you with questions regarding bills from these companies.  You will be contacted with the lab results as soon as they are available. The fastest way to get your results is to activate your My Chart account. Instructions are located on the last page of this paperwork. If you have not heard from us regarding the results in 2 weeks, please contact this office.      

## 2017-10-05 ENCOUNTER — Encounter (INDEPENDENT_AMBULATORY_CARE_PROVIDER_SITE_OTHER): Payer: Self-pay | Admitting: Family

## 2017-10-05 ENCOUNTER — Ambulatory Visit (INDEPENDENT_AMBULATORY_CARE_PROVIDER_SITE_OTHER): Payer: BLUE CROSS/BLUE SHIELD | Admitting: Family

## 2017-10-05 VITALS — Ht 60.0 in | Wt 258.0 lb

## 2017-10-05 DIAGNOSIS — M722 Plantar fascial fibromatosis: Secondary | ICD-10-CM

## 2017-10-05 NOTE — Progress Notes (Signed)
Office Visit Note   Patient: Barbara Giles           Date of Birth: 23-May-1980           MRN: [id number] Visit Date: 10/05/2017              Requested by: Sherren Mocha, MD [address] [place], [place] [address] PCP: Sherren Mocha, MD  Chief Complaint  Patient presents with  . Left Foot - Pain      HPI: The patient is a 37 year old woman who presents today complaining of left heel pain plantar aspect.  This is been ongoing for 2 weeks this episode.  Did have a similar episode a few years ago.  Has had emergency visits for this occurrence.  Was evaluated by her primary care physician following the ED visit.  PCP visit on July 26.  X-rays were obtained.  These did show a calcaneal spur.  Has been told she has plantar fasciitis.  Trial of a prednisone course.  This wa not helpful.  Has tried anti-inflammatories without relief.  Has been told to wear supportive shoewear however today is in slide on sandals.  Assessment & Plan: Visit Diagnoses:  1. Plantar fasciitis, left     Plan: Left plantar fascia injected without incident.  Discussed the importance of supportive shoe wear orthotics and supportive walking shoes recommended Brooks or new balance.  Recommended heel cord stretching she may use ice may use ibuprofen.  Discussed the course of plantar fasciitis.  Follow-Up Instructions: Return in about 3 weeks (around 10/26/2017), or if symptoms worsen or fail to improve.   Ortho Exam  Patient is alert, oriented, no adenopathy, well-dressed, normal affect, normal respiratory effort. On examination does have pes planus.  Point tender to origin of plantar fascia on the left.  No erythema no sign of infection no tenderness with lateral compression of the calcaneus.  Achilles is nontender.  Imaging: No results found. No images are attached to the encounter.  Labs: Lab Results  Component Value Date   HGBA1C 5.4 05/15/2015   LAB[facility] Normal Upper Respiratory Flora 05/15/2011   [facility]  No Beta Hemolytic Streptococci Isolated 05/15/2011     Lab Results  Component Value Date   ALBUMIN 3.9 05/15/2015   ALBUMIN 3.9 04/26/2014   ALBUMIN 4.1 01/25/2008    Body mass index is 50.39 kg/m.  Orders:  No orders of the defined types were placed in this encounter.  No orders of the defined types were placed in this encounter.    Procedures: Foot Inj Date/Time: 10/24/2017 2:04 PM Performed by: Adonis Huguenin, NP Authorized by: Adonis Huguenin, NP   Consent Given by:  Patient Site marked: the procedure site was marked   Timeout: prior to procedure the correct patient, procedure, and site was verified   Indications:  Fasciitis and pain Condition: Plantar Fasciitis   Location: left plantar fascia muscle   Prep: patient was prepped and draped in usual sterile fashion   Needle Size:  22 G Medications:  2 mL lidocaine 1 %; 40 mg methylPREDNISolone acetate 40 MG/ML Patient Tolerance:  Patient tolerated the procedure well with no immediate complications    Clinical Data: No additional findings.  ROS:  All other systems negative, except as noted in the HPI. Review of Systems  Constitutional: Negative for chills and fever.  Musculoskeletal: Positive for myalgias.    Objective: Vital Signs: Ht 5' (1.524 m)   Wt 258 lb (117 kg)  LMP 09/19/2017   BMI 50.39 kg/m   Specialty Comments:  No specialty comments available.  PMFS History: Patient Active Problem List   Diagnosis Date Noted  . Hypertension 11/05/2015  . 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 Vitamin D deficiency 04/27/2014   Past Medical History:  Diagnosis Date  . Bronchitis   . Hypertension   . Obesity   . Personal history of kidney stones     Family History  Problem Relation Age of Onset  . Hypertension Mother   . Hyperlipidemia Mother   . Depression Father   . Myasthenia gravis Father   . Cancer Father         pancreatic cancer  . Diabetes Father   . Hyperlipidemia Father   . Mental illness Father   . Cancer Sister        breast cancer  . Hyperlipidemia Sister   . Hypertension Sister   . Cancer Paternal Aunt        breast cancer  . Mental illness Paternal Grandmother   . Hyperlipidemia Paternal Grandfather     Past Surgical History:  Procedure Laterality Date  . ANTERIOR CRUCIATE LIGAMENT REPAIR Right 09/30/2015   Procedure: RIGHT RECONSTRUCTION ANTERIOR CRUCIATE LIGAMENT (ACL) WITH HAMSTRING GRAFT;  Surgeon: Cammy CopaScott Gregory Dean, MD;  Location: [facility] OR;  Service: Orthopedics;  Laterality: Right;  . MENISECTOMY Right 09/30/2015   Procedure: PARTIAL MEDIAL MENISCECTOMY;  Surgeon: Cammy CopaScott Gregory Dean, MD;  [facility];  Service: Orthopedics;  Laterality: Right;  . 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 Smoking status: Never Smoker  . Smokeless tobacco: Never Used  Substance and Sexual Activity  . Alcohol use: Yes    Comment: rarely  . Drug use: No  . Sexual activity: Yes

## 2017-10-24 DIAGNOSIS — M722 Plantar fascial fibromatosis: Secondary | ICD-10-CM | POA: Diagnosis not present

## 2017-10-24 MED ORDER — LIDOCAINE HCL 1 % IJ SOLN
2.0000 mL | INTRAMUSCULAR | Status: AC | PRN
  Administered 2017-10-24: 2 mL

## 2017-10-24 MED ORDER — METHYLPREDNISOLONE ACETATE 40 MG/ML IJ SUSP
40.0000 mg | INTRAMUSCULAR | Status: AC | PRN
  Administered 2017-10-24: 40 mg

## 2017-11-03 ENCOUNTER — Encounter: Payer: Self-pay | Admitting: Family Medicine

## 2017-11-03 ENCOUNTER — Ambulatory Visit (INDEPENDENT_AMBULATORY_CARE_PROVIDER_SITE_OTHER): Payer: BLUE CROSS/BLUE SHIELD | Admitting: Family Medicine

## 2017-11-03 ENCOUNTER — Other Ambulatory Visit: Payer: Self-pay

## 2017-11-03 VITALS — BP 124/78 | HR 73 | Temp 98.3°F | Resp 18 | Ht 60.0 in | Wt 252.8 lb

## 2017-11-03 DIAGNOSIS — N940 Mittelschmerz: Secondary | ICD-10-CM

## 2017-11-03 DIAGNOSIS — I10 Essential (primary) hypertension: Secondary | ICD-10-CM

## 2017-11-03 DIAGNOSIS — Z6841 Body Mass Index (BMI) 40.0 and over, adult: Secondary | ICD-10-CM

## 2017-11-03 DIAGNOSIS — E66813 Obesity, class 3: Secondary | ICD-10-CM

## 2017-11-03 DIAGNOSIS — E559 Vitamin D deficiency, unspecified: Secondary | ICD-10-CM

## 2017-11-03 DIAGNOSIS — R102 Pelvic and perineal pain: Secondary | ICD-10-CM | POA: Diagnosis not present

## 2017-11-03 DIAGNOSIS — Z136 Encounter for screening for cardiovascular disorders: Secondary | ICD-10-CM

## 2017-11-03 DIAGNOSIS — Z0000 Encounter for general adult medical examination without abnormal findings: Secondary | ICD-10-CM

## 2017-11-03 DIAGNOSIS — Z124 Encounter for screening for malignant neoplasm of cervix: Secondary | ICD-10-CM

## 2017-11-03 DIAGNOSIS — Z114 Encounter for screening for human immunodeficiency virus [HIV]: Secondary | ICD-10-CM

## 2017-11-03 DIAGNOSIS — N83202 Unspecified ovarian cyst, left side: Secondary | ICD-10-CM

## 2017-11-03 DIAGNOSIS — Z113 Encounter for screening for infections with a predominantly sexual mode of transmission: Secondary | ICD-10-CM

## 2017-11-03 DIAGNOSIS — Z130 Encounter for screening for diseases of the blood and blood-forming organs and certain disorders involving the immune mechanism: Secondary | ICD-10-CM

## 2017-11-03 DIAGNOSIS — Z1389 Encounter for screening for other disorder: Secondary | ICD-10-CM

## 2017-11-03 DIAGNOSIS — Z1383 Encounter for screening for respiratory disorder NEC: Secondary | ICD-10-CM

## 2017-11-03 DIAGNOSIS — N926 Irregular menstruation, unspecified: Secondary | ICD-10-CM

## 2017-11-03 LAB — POCT URINALYSIS DIP (MANUAL ENTRY)
Bilirubin, UA: NEGATIVE
Blood, UA: NEGATIVE
GLUCOSE UA: NEGATIVE mg/dL
Ketones, POC UA: NEGATIVE mg/dL
LEUKOCYTES UA: NEGATIVE
Nitrite, UA: NEGATIVE
Protein Ur, POC: NEGATIVE mg/dL
Spec Grav, UA: 1.02 (ref 1.010–1.025)
UROBILINOGEN UA: 0.2 U/dL
pH, UA: 6 (ref 5.0–8.0)

## 2017-11-03 MED ORDER — LISINOPRIL 10 MG PO TABS: 10.0000 mg | ORAL_TABLET | Freq: Every day | ORAL | 0 refills | Status: DC

## 2017-11-03 MED ORDER — LISINOPRIL 10 MG PO TABS: 10.0000 mg | ORAL_TABLET | Freq: Every day | ORAL | 3 refills | Status: AC

## 2017-11-03 NOTE — Patient Instructions (Addendum)
Health Maintenance, Female Adopting a healthy lifestyle and getting preventive care can go a long way to promote health and wellness. Talk with your health care provider about what schedule of regular examinations is right for you. This is a good chance for you to check in with your provider about disease prevention and staying healthy. In between checkups, there are plenty of things you can do on your own. Experts have done a lot of research about which lifestyle changes and preventive measures are most likely to keep you healthy. Ask your health care provider for more information. Weight and diet Eat a healthy diet  Be sure to include plenty of vegetables, fruits, low-fat dairy products, and lean protein.  Do not eat a lot of foods high in solid fats, added sugars, or salt.  Get regular exercise. This is one of the most important things you can do for your health. ? Most adults should exercise for at least 150 minutes each week. The exercise should increase your heart rate and make you sweat (moderate-intensity exercise). ? Most adults should also do strengthening exercises at least twice a week. This is in addition to the moderate-intensity exercise.  Maintain a healthy weight  Body mass index (BMI) is a measurement that can be used to identify possible weight problems. It estimates body fat based on height and weight. Your health care provider can help determine your BMI and help you achieve or maintain a healthy weight.  For females 46 years of age and older: ? A BMI below 18.5 is considered underweight. ? A BMI of 18.5 to 24.9 is normal. ? A BMI of 25 to 29.9 is considered overweight. ? A BMI of 30 and above is considered obese.  Watch levels of cholesterol and blood lipids  You should start having your blood tested for lipids and cholesterol at 39 years of age, then have this test every 5 years.  You may need to have your cholesterol levels checked more often if: ? Your lipid  or cholesterol levels are high. ? You are older than 39 years of age. ? You are at high risk for heart disease.  Cancer screening Lung Cancer  Lung cancer screening is recommended for adults 79-62 years old who are at high risk for lung cancer because of a history of smoking.  A yearly low-dose CT scan of the lungs is recommended for people who: ? Currently smoke. ? Have quit within the past 15 years. ? Have at least a 30-pack-year history of smoking. A pack year is smoking an average of one pack of cigarettes a day for 1 year.  Yearly screening should continue until it has been 15 years since you quit.  Yearly screening should stop if you develop a health problem that would prevent you from having lung cancer treatment.  Breast Cancer  Practice breast self-awareness. This means understanding how your breasts normally appear and feel.  It also means doing regular breast self-exams. Let your health care provider know about any changes, no matter how small.  If you are in your 20s or 30s, you should have a clinical breast exam (CBE) by a health care provider every 1-3 years as part of a regular health exam.  If you are 59 or older, have a CBE every year. Also consider having a breast X-ray (mammogram) every year.  If you have a family history of breast cancer, talk to your health care provider about genetic screening.  If you are at high  risk for breast cancer, talk to your health care provider about having an MRI and a mammogram every year.  Breast cancer gene (BRCA) assessment is recommended for women who have family members with BRCA-related cancers. BRCA-related cancers include: ? Breast. ? Ovarian. ? Tubal. ? Peritoneal cancers.  Results of the assessment will determine the need for genetic counseling and BRCA1 and BRCA2 testing.  Cervical Cancer Your health care provider may recommend that you be screened regularly for cancer of the pelvic organs (ovaries, uterus, and  vagina). This screening involves a pelvic examination, including checking for microscopic changes to the surface of your cervix (Pap test). You may be encouraged to have this screening done every 3 years, beginning at age 21.  For women ages 30-65, health care providers may recommend pelvic exams and Pap testing every 3 years, or they may recommend the Pap and pelvic exam, combined with testing for human papilloma virus (HPV), every 5 years. Some types of HPV increase your risk of cervical cancer. Testing for HPV may also be done on women of any age with unclear Pap test results.  Other health care providers may not recommend any screening for nonpregnant women who are considered low risk for pelvic cancer and who do not have symptoms. Ask your health care provider if a screening pelvic exam is right for you.  If you have had past treatment for cervical cancer or a condition that could lead to cancer, you need Pap tests and screening for cancer for at least 20 years after your treatment. If Pap tests have been discontinued, your risk factors (such as having a new sexual partner) need to be reassessed to determine if screening should resume. Some women have medical problems that increase the chance of getting cervical cancer. In these cases, your health care provider may recommend more frequent screening and Pap tests.  Colorectal Cancer  This type of cancer can be detected and often prevented.  Routine colorectal cancer screening usually begins at 39 years of age and continues through 39 years of age.  Your health care provider may recommend screening at an earlier age if you have risk factors for colon cancer.  Your health care provider may also recommend using home test kits to check for hidden blood in the stool.  A small camera at the end of a tube can be used to examine your colon directly (sigmoidoscopy or colonoscopy). This is done to check for the earliest forms of colorectal  cancer.  Routine screening usually begins at age 50.  Direct examination of the colon should be repeated every 5-10 years through 39 years of age. However, you may need to be screened more often if early forms of precancerous polyps or small growths are found.  Skin Cancer  Check your skin from head to toe regularly.  Tell your health care provider about any new moles or changes in moles, especially if there is a change in a mole's shape or color.  Also tell your health care provider if you have a mole that is larger than the size of a pencil eraser.  Always use sunscreen. Apply sunscreen liberally and repeatedly throughout the day.  Protect yourself by wearing long sleeves, pants, a wide-brimmed hat, and sunglasses whenever you are outside.  Heart disease, diabetes, and high blood pressure  High blood pressure causes heart disease and increases the risk of stroke. High blood pressure is more likely to develop in: ? People who have blood pressure in the high end   of the normal range (130-139/85-89 mm Hg). ? People who are overweight or obese. ? People who are African American.  If you are 18-39 years of age, have your blood pressure checked every 3-5 years. If you are 40 years of age or older, have your blood pressure checked every year. You should have your blood pressure measured twice-once when you are at a hospital or clinic, and once when you are not at a hospital or clinic. Record the average of the two measurements. To check your blood pressure when you are not at a hospital or clinic, you can use: ? An automated blood pressure machine at a pharmacy. ? A home blood pressure monitor.  If you are between 55 years and 79 years old, ask your health care provider if you should take aspirin to prevent strokes.  Have regular diabetes screenings. This involves taking a blood sample to check your fasting blood sugar level. ? If you are at a normal weight and have a low risk for diabetes,  have this test once every three years after 39 years of age. ? If you are overweight and have a high risk for diabetes, consider being tested at a younger age or more often. Preventing infection Hepatitis B  If you have a higher risk for hepatitis B, you should be screened for this virus. You are considered at high risk for hepatitis B if: ? You were born in a country where hepatitis B is common. Ask your health care provider which countries are considered high risk. ? Your parents were born in a high-risk country, and you have not been immunized against hepatitis B (hepatitis B vaccine). ? You have HIV or AIDS. ? You use needles to inject street drugs. ? You live with someone who has hepatitis B. ? You have had sex with someone who has hepatitis B. ? You get hemodialysis treatment. ? You take certain medicines for conditions, including cancer, organ transplantation, and autoimmune conditions.  Hepatitis C  Blood testing is recommended for: ? Everyone born from 1945 through 1965. ? Anyone with known risk factors for hepatitis C.  Sexually transmitted infections (STIs)  You should be screened for sexually transmitted infections (STIs) including gonorrhea and chlamydia if: ? You are sexually active and are younger than 39 years of age. ? You are older than 39 years of age and your health care provider tells you that you are at risk for this type of infection. ? Your sexual activity has changed since you were last screened and you are at an increased risk for chlamydia or gonorrhea. Ask your health care provider if you are at risk.  If you do not have HIV, but are at risk, it may be recommended that you take a prescription medicine daily to prevent HIV infection. This is called pre-exposure prophylaxis (PrEP). You are considered at risk if: ? You are sexually active and do not regularly use condoms or know the HIV status of your partner(s). ? You take drugs by injection. ? You are  sexually active with a partner who has HIV.  Talk with your health care provider about whether you are at high risk of being infected with HIV. If you choose to begin PrEP, you should first be tested for HIV. You should then be tested every 3 months for as long as you are taking PrEP. Pregnancy  If you are premenopausal and you may become pregnant, ask your health care provider about preconception counseling.  If you may   pregnant, take 400 to 800 micrograms (mcg) of folic acid every day.  If you want to prevent pregnancy, talk to your health care provider about birth control (contraception). Osteoporosis and menopause  Osteoporosis is a disease in which the bones lose minerals and strength with aging. This can result in serious bone fractures. Your risk for osteoporosis can be identified using a bone density scan.  If you are 65 years of age or older, or if you are at risk for osteoporosis and fractures, ask your health care provider if you should be screened.  Ask your health care provider whether you should take a calcium or vitamin D supplement to lower your risk for osteoporosis.  Menopause may have certain physical symptoms and risks.  Hormone replacement therapy may reduce some of these symptoms and risks. Talk to your health care provider about whether hormone replacement therapy is right for you. Follow these instructions at home:  Schedule regular health, dental, and eye exams.  Stay current with your immunizations.  Do not use any tobacco products including cigarettes, chewing tobacco, or electronic cigarettes.  If you are pregnant, do not drink alcohol.  If you are breastfeeding, limit how much and how often you drink alcohol.  Limit alcohol intake to no more than 1 drink per day for nonpregnant women. One drink equals 12 ounces of beer, 5 ounces of wine, or 1 ounces of hard liquor.  Do not use street drugs.  Do not share needles.  Ask your health care  provider for help if you need support or information about quitting drugs.  Tell your health care provider if you often feel depressed.  Tell your health care provider if you have ever been abused or do not feel safe at home. This information is not intended to replace advice given to you by your health care provider. Make sure you discuss any questions you have with your health care provider. Document Released: 09/07/2010 Document Revised: 07/31/2015 Document Reviewed: 11/26/2014 Elsevier Interactive Patient Education  2018 [employer] Inc.     If you have lab work done today you will be contacted with your lab results within the next 2 weeks.  If you have not heard from us then please contact us. The fastest way to get your results is to register for My Chart.   IF you received an x-ray today, you will receive an invoice from [facility]. Please contact [facility] at [phone] with questions or concerns regarding your invoice.   IF you received labwork today, you will receive an invoice from [facility]. Please contact [facility] at [phone] with questions or concerns regarding your invoice.   Our billing staff will not be able to assist you with questions regarding bills from these companies.  You will be contacted with the lab results as soon as they are available. The fastest way to get your results is to activate your My Chart account. Instructions are located on the last page of this paperwork. If you have not heard from us regarding the results in 2 weeks, please contact this office.      

## 2017-11-03 NOTE — Progress Notes (Addendum)
Subjective:    Patient ID: Barbara Giles, female    DOB: [birth year], 37 y.o.   MRN: [id number] Chief Complaint  Patient presents with  . Annual Exam    HPI  Primary Preventative Screenings: Cervical Cancer: ASCUS pap w/ neg HR HPV 05/03/2014 - so due for repeat today Family Planning:nordette after sev wk still causing nausea, anorexia, bloating gas so stopped it as not in relationship, not dating, no plans to change that, never has casual sexual encounters - is aware of absolute need for birth control if that ever changes as on teratogenic lisinopril STI screening: denies need but consents to routine one time HIV screen rec for all adults by CDC today, had neg gc/chlam 04/2014 and denies encounter since Breast Cancer: [facility] - rec routine screening start at 38 Colorectal Cancer: NA due to age Tobacco use/EtOH/substances: none to all Bone Density: NA due to age Cardiac: done 09/29/2015 - NSR, normal. Weight/Blood sugar/Diet/Exercise: sometimes exercises BMI Readings from Last 3 Encounters:  11/03/17 49.37 kg/m  10/05/17 50.39 kg/m  09/30/17 49.60 kg/m   Lab Results  Component Value Date   HGBA1C 5.4 05/15/2015   OTC/Vit/Supp/Herbal: Dentist/Optho: Immunizations: pt reports that she had her tetanus updated in 2016 and Hep B vaccine in 2019 Immunization History  Administered Date(s) Administered  . Influenza Split 01/06/2014, 12/07/2014  . Influenza,inj,Quad PF,6+ Mos 02/04/2017  . Influenza-Unspecified 01/07/2016  . Tdap 03/08/2009     Chronic Medical Conditions: HTN: On lisinopril 10 her BP at home often runs 120s/70s-80    Past Medical History:  Diagnosis Date  . Bronchitis   . Hypertension   . Obesity   . Personal history of kidney stones    Past Surgical History:  Procedure Laterality Date  . ANTERIOR CRUCIATE LIGAMENT REPAIR Right 09/30/2015   Procedure: RIGHT RECONSTRUCTION ANTERIOR CRUCIATE LIGAMENT (ACL) WITH HAMSTRING GRAFT;   Surgeon: Cammy Copa, MD;  Location: [facility];  Service: Orthopedics;  Laterality: Right;  . MENISECTOMY Right 09/30/2015   Procedure: PARTIAL MEDIAL MENISCECTOMY;  Surgeon: Cammy Copa, MD;  Location: [facility] OR;  Service: Orthopedics;  Laterality: Right;  . TONSILLECTOMY     Current Outpatient Medications on File Prior to Visit  Medication Sig Dispense Refill  . lisinopril (PRINIVIL,ZESTRIL) 10 MG tablet Take 1 tablet (10 mg total) by mouth daily. Needs office visit for additional refills 90 tablet 0   No current facility-administered medications on file prior to visit.    No Known Allergies Family History  Problem Relation Age of Onset  . Hypertension Mother   . Hyperlipidemia Mother   . Depression Father   . Myasthenia gravis Father   . Cancer Father        pancreatic cancer  . Diabetes Father   . Hyperlipidemia Father   . Mental illness Father   . Cancer Sister        breast cancer  . Hyperlipidemia Sister   . Hypertension Sister   . Cancer Paternal Aunt        breast cancer  . Mental illness Paternal Grandmother   . Hyperlipidemia Paternal Grandfather    Social History   Socioeconomic History  . Marital status: Single    Spouse name: Not on file  . Number of children: Not on file  . Years of education: Not on file  . 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 Financial resource strain:  Not on file  . Food insecurity:    Worry: Not on file    Inability: Not on file  . Transportation needs:    Medical: Not on file    Non-medical: Not on file  Tobacco Use  . Smoking status: Never Smoker  . Smokeless tobacco: Never Used  Substance and Sexual Activity  . Alcohol use: Yes    Comment: rarely  . Drug use: No  . Sexual activity: Yes  Lifestyle  . Physical activity:    Days per week: Not on file    Minutes per session: Not on file  . Stress: Not on file  Relationships  . Social  connections:    Talks on phone: Not on file    Gets together: Not on file    Attends religious service: Not on file    Active member of club or organization: Not on file    Attends meetings of clubs or organizations: Not on file    Relationship status: Not on file  Other Topics Concern  . Not on file  Social History Narrative  . Not on file   [facility] 2/9 11/03/2017 09/30/2017 03/18/2016 08/01/2015 05/15/2015  Decreased Interest 0 0 0 0 0  Down, Depressed, Hopeless 0 0 0 0 0  PHQ - 2 Score 0 0 0 0 0   Review of Systems  Cardiovascular: Positive for chest pain.  Endocrine: Positive for polydipsia.  Genitourinary: Positive for menstrual problem.  Skin: Positive for color change.  All other systems reviewed and are negative.  See hpi    Objective:   Physical Exam  Constitutional: She is oriented to person, place, and time. She appears well-developed and well-nourished. No distress.  HENT:  Head: Normocephalic and atraumatic.  Right Ear: Tympanic membrane, external ear and ear canal normal.  Left Ear: Tympanic membrane, external ear and ear canal normal.  Nose: Mucosal edema present. No rhinorrhea.  Mouth/Throat: Uvula is midline, oropharynx is clear and moist and mucous membranes are normal. No posterior oropharyngeal erythema.  Eyes: Pupils are equal, round, and reactive to light. Conjunctivae and EOM are normal. Right eye exhibits no discharge. Left eye exhibits no discharge. No scleral icterus.  Neck: Normal range of motion. Neck supple. No thyromegaly present.  Cardiovascular: Normal rate, regular rhythm, normal heart sounds and intact distal pulses.  Pulmonary/Chest: Effort normal and breath sounds normal. No respiratory distress. No breast tenderness, discharge or bleeding.  Abdominal: Soft. Bowel sounds are normal. There is no tenderness.  Genitourinary: Vagina normal and uterus normal. No breast tenderness, discharge or bleeding. Cervix exhibits motion tenderness.  Cervix exhibits no friability. Right adnexum displays mass and tenderness. Left adnexum displays tenderness and fullness.  Musculoskeletal: She exhibits no edema.  Lymphadenopathy:    She has no cervical adenopathy.  Neurological: She is alert and oriented to person, place, and time. She has normal reflexes.  Skin: Skin is warm and dry. She is not diaphoretic. No erythema.  Psychiatric: She has a normal mood and affect. Her behavior is normal.   BP 124/78   Pulse 73   Temp 98.3 F (36.8 C) (Oral)   Resp 18   Ht 5' (1.524 m)   Wt 252 lb 12.8 oz (114.7 kg)   SpO2 98%   BMI 49.37 kg/m       Visual Acuity Screening   Right eye Left eye Both eyes  Without correction: 20/13-1 20/13 20/13-1  With correction:       Assessment & Plan:  1. Annual physical exam   2. Ovulation pain - check [place]  3. Adnexal pain   4. Routine screening for STI (sexually transmitted infection)   5. Screening for cardiovascular, respiratory, and genitourinary diseases   6. Screening for cervical cancer - ASCUS pap w/ neg HR HPV 3.5 yrs ago - due for repeat today.   7. Screening for deficiency anemia   8. Screening for HIV (human immunodeficiency virus) - routine one time screen  9. Menstrual changes - no longer taking the nordette (levonorgestrel-ethinyl estadiol) 0.15-30 mg-mcg for OCPs as not involved in any sexually active relationship  10. Essential hypertension - very well controlled on lisinopril 10 - pt understands the teratogenic effects of acei med on fetus even before pregnancy is realized so abs necessary to use protection and come in to discuss birth control options vs changing BP med if ever gets involved in relationship or might possibly decide to have vaginal intercourse  11. Vitamin D deficiency   12. Class 3 severe obesity due to excess calories with serious comorbidity and body mass index (BMI) of 45.0 to 49.9 in adult [facility])   declines flu vaccine  Orders Placed This Encounter  Procedures    . US Transvaginal Non-OB    Standing Status:   Future    Number of Occurrences:   1    Standing Expiration Date:   01/04/2019    Order Specific Question:   Reason for Exam (SYMPTOM  OR DIAGNOSIS REQUIRED)    Answer:   worsening ovulatory pain, adnexal tenderness and fullness on exam, menses shortening    Order Specific Question:   Preferred imaging location?    Answer:   [facility]  . US Pelvis Complete    Standing Status:   Future    Number of Occurrences:   1    Standing Expiration Date:   01/04/2019    Order Specific Question:   Reason for Exam (SYMPTOM  OR DIAGNOSIS REQUIRED)    Answer:   worsening ovulatory pain, adnexal tenderness and fullness on exam, menses shortening    Order Specific Question:   Preferred imaging location?    Answer:   [facility]  . Comprehensive metabolic panel  . HIV antibody  . TSH  . VITAMIN D 25 Hydroxy (Vit-D Deficiency, Fractures)  . CBC with Differential/Platelet  . Hemoglobin A1c  . POCT urinalysis dipstick    Meds ordered this encounter  Medications  . DISCONTD: lisinopril (PRINIVIL,ZESTRIL) 10 MG tablet    Sig: Take 1 tablet (10 mg total) by mouth daily. Needs office visit for additional refills    Dispense:  90 tablet    Refill:  0    This prescription was filled on 02/15/2017. Any refills authorized will be placed on file.  Marland Kitchen lisinopril (PRINIVIL,ZESTRIL) 10 MG tablet    Sig: Take 1 tablet (10 mg total) by mouth daily.    Dispense:  90 tablet    Refill:  3  Meds to Karin Golden on Corinna Gab, MD, MPH Primary Care at [facility] [address] [place], [place]  [address] [phone] Office phone  [phone] Office fax   12/25/17 5:21 PM

## 2017-11-04 LAB — CBC WITH DIFFERENTIAL/PLATELET
BASOS: 0 %
Basophils Absolute: 0 10*3/uL (ref 0.0–0.2)
EOS (ABSOLUTE): 0.2 10*3/uL (ref 0.0–0.4)
Eos: 3 %
HEMATOCRIT: 37.2 % (ref 34.0–46.6)
Hemoglobin: 11.8 g/dL (ref 11.1–15.9)
Immature Grans (Abs): 0 10*3/uL (ref 0.0–0.1)
Immature Granulocytes: 0 %
LYMPHS ABS: 2.6 10*3/uL (ref 0.7–3.1)
Lymphs: 29 %
MCH: 25.5 pg — ABNORMAL LOW (ref 26.6–33.0)
MCHC: 31.7 g/dL (ref 31.5–35.7)
MCV: 80 fL (ref 79–97)
MONOS ABS: 0.5 10*3/uL (ref 0.1–0.9)
Monocytes: 6 %
Neutrophils Absolute: 5.5 10*3/uL (ref 1.4–7.0)
Neutrophils: 62 %
Platelets: 360 10*3/uL (ref 150–450)
RBC: 4.63 x10E6/uL (ref 3.77–5.28)
RDW: 15 % (ref 12.3–15.4)
WBC: 8.9 10*3/uL (ref 3.4–10.8)

## 2017-11-04 LAB — COMPREHENSIVE METABOLIC PANEL
ALT: 35 IU/L — AB (ref 0–32)
AST: 26 IU/L (ref 0–40)
Albumin/Globulin Ratio: 1.3 (ref 1.2–2.2)
Albumin: 4.1 g/dL (ref 3.5–5.5)
Alkaline Phosphatase: 63 IU/L (ref 39–117)
BUN/Creatinine Ratio: 13 (ref 9–23)
BUN: 8 mg/dL (ref 6–20)
Bilirubin Total: <0.2 mg/dL (ref 0.0–1.2)
CALCIUM: 9.3 mg/dL (ref 8.7–10.2)
CO2: 24 mmol/L (ref 20–29)
CREATININE: 0.6 mg/dL (ref 0.57–1.00)
Chloride: 102 mmol/L (ref 96–106)
GFR calc Af Amer: 134 mL/min/{1.73_m2} (ref >59)
GFR calc non Af Amer: 116 mL/min/{1.73_m2} (ref >59)
GLOBULIN, TOTAL: 3.1 g/dL (ref 1.5–4.5)
Glucose: 71 mg/dL (ref 65–99)
Potassium: 4.8 mmol/L (ref 3.5–5.2)
SODIUM: 139 mmol/L (ref 134–144)
Total Protein: 7.2 g/dL (ref 6.0–8.5)

## 2017-11-04 LAB — HEMOGLOBIN A1C
Est. average glucose Bld gHb Est-mCnc: 111 mg/dL
Hgb A1c MFr Bld: 5.5 % (ref 4.8–5.6)

## 2017-11-04 LAB — HIV ANTIBODY (ROUTINE TESTING W REFLEX): HIV Screen 4th Generation wRfx: NONREACTIVE

## 2017-11-04 LAB — VITAMIN D 25 HYDROXY (VIT D DEFICIENCY, FRACTURES): VIT D 25 HYDROXY: 18.1 ng/mL — AB (ref 30.0–100.0)

## 2017-11-04 LAB — TSH: TSH: 1.09 u[IU]/mL (ref 0.450–4.500)

## 2017-11-05 LAB — PAP IG AND HPV HIGH-RISK
HPV, high-risk: NEGATIVE
PAP Smear Comment: 0

## 2017-11-29 ENCOUNTER — Telehealth: Payer: Self-pay | Admitting: Family Medicine

## 2017-11-29 ENCOUNTER — Ambulatory Visit (HOSPITAL_COMMUNITY)
Admission: RE | Admit: 2017-11-29 | Discharge: 2017-11-29 | Disposition: A | Discharge status: Home / Self Care | Payer: BLUE CROSS/BLUE SHIELD | Source: Ambulatory Visit | Attending: Family Medicine | Admitting: Family Medicine

## 2017-11-29 DIAGNOSIS — R102 Pelvic and perineal pain: Secondary | ICD-10-CM | POA: Diagnosis present

## 2017-11-29 DIAGNOSIS — N940 Mittelschmerz: Secondary | ICD-10-CM | POA: Diagnosis present

## 2017-11-29 DIAGNOSIS — N83202 Unspecified ovarian cyst, left side: Secondary | ICD-10-CM | POA: Insufficient documentation

## 2017-11-29 NOTE — Telephone Encounter (Signed)
Please advise 

## 2017-11-29 NOTE — Telephone Encounter (Signed)
Copied from CRM [phone]#[id number]. Topic: General - Other >> Nov 29, 2017 10:42 AM Darletta MollLander, Lumin L wrote: Reason for CRM: Patient would like a call back to review abdominal US from today.

## 2017-11-30 ENCOUNTER — Encounter: Payer: Self-pay | Admitting: Family Medicine

## 2017-11-30 NOTE — Telephone Encounter (Signed)
Patient is requesting a call from any provider to dicsuus as soon possible ,please advise [phone]

## 2017-12-01 NOTE — Telephone Encounter (Signed)
Pt called requesting [place] results.  Advised pt that provider has not reviewed results.  Pt is very concerned and would like results asap.

## 2017-12-02 ENCOUNTER — Other Ambulatory Visit: Payer: Self-pay | Admitting: Family Medicine

## 2017-12-02 DIAGNOSIS — N83202 Unspecified ovarian cyst, left side: Secondary | ICD-10-CM

## 2017-12-02 NOTE — Telephone Encounter (Signed)
Called pt and informed her that the provider has sent a My chart message to her.   She stated understanding

## 2017-12-02 NOTE — Telephone Encounter (Signed)
I have spoken to the pt today and she is frustrated that it has been several days and she has not gotten any information. Please advise on the results. I will relay it to the patient if needed.   Please advise on the result of the pelvic US.   Thanks, [employer]

## 2017-12-12 ENCOUNTER — Other Ambulatory Visit: Payer: Self-pay | Admitting: Family Medicine

## 2017-12-25 MED ORDER — VITAMIN D (ERGOCALCIFEROL) 1.25 MG (50000 UNIT) PO CAPS: 50000.0000 [IU] | ORAL_CAPSULE | ORAL | 0 refills | Status: AC

## 2017-12-25 NOTE — Telephone Encounter (Signed)
Sent pt a MyChart message

## 2017-12-31 ENCOUNTER — Encounter: Payer: Self-pay | Admitting: Family Medicine

## 2017-12-31 NOTE — Addendum Note (Signed)
Addended by: Sherren Mocha on: 12/31/2017 04:17 AM   Modules accepted: Orders

## 2018-08-25 ENCOUNTER — Ambulatory Visit: Payer: BLUE CROSS/BLUE SHIELD | Admitting: Physician Assistant

## 2018-08-28 ENCOUNTER — Ambulatory Visit (INDEPENDENT_AMBULATORY_CARE_PROVIDER_SITE_OTHER): Payer: BLUE CROSS/BLUE SHIELD | Admitting: Physician Assistant

## 2018-08-28 ENCOUNTER — Ambulatory Visit: Payer: Self-pay

## 2018-08-28 ENCOUNTER — Encounter: Payer: Self-pay | Admitting: Physician Assistant

## 2018-08-28 ENCOUNTER — Other Ambulatory Visit: Payer: Self-pay

## 2018-08-28 VITALS — Ht 60.0 in | Wt 252.8 lb

## 2018-08-28 DIAGNOSIS — M25571 Pain in right ankle and joints of right foot: Secondary | ICD-10-CM

## 2018-08-28 NOTE — Progress Notes (Signed)
Office Visit Note   Patient: Barbara Giles           Date of Birth: 03/05/1981           MRN: [id number] Visit Date: 08/28/2018              Requested by: Barbara Giles No address on file PCP: Barbara Giles  Chief Complaint  Patient presents with  . Right Leg - Pain  . Tendonitis    pain      HPI: The patient is a 38 yo woman who is seen for evaluation of right ankle pain. She reports the pain started last week. She has to stand for long periods at work and reports it was very painful last week. The pain is a little better this week following taking some Advil. She indicates pain over the lateral ankle just posterior to the malleolus and some anterior ankle pain. She normally wears tennis shoes with Doctor Scholl's inserts for a history of plantar fasciitis and reports she is not having any symptoms of plantar fasciitis now. She feels the pain is made worse by going up and down stairs or having to flex her foot/ankle a lot.   Assessment & Plan: Visit Diagnoses:  1. Pain in right ankle and joints of right foot     Plan: After informed consent, the right ankle was injected with a combination of lidocaine and Depo medrol and the patient tolerated this well. Will also provide an ASO brace for the right ankle and recommended Voltaren gel to the area prn up to 4 times daily and stiff soled shoes. Also reviewed Achilles stretching exercises and instructed the patient to do 5 times daily.  She will follow up in 4 weeks.   Follow-Up Instructions: Return in about 4 weeks (around 09/25/2018).   Ortho Exam  Patient is alert, oriented, no adenopathy, well-dressed, normal affect, normal respiratory effort. The right ankle is tender to palpation over the medial ankle joint and laterally just posterior to the malleolus. The right ankle dorsiflexion is to neutral. She She has flexible pes planus with a tight heel cord. She is able to toe and heel walk and there is no instability of the  posterior tibial tendon. Good pedal pulses.  The right ankle was injected with lidocaine and steroid as noted under sterile techniques and the patient tolerated this well.   Imaging: No results found. No images are attached to the encounter.  Labs: Lab Results  Component Value Date   HGBA1C 5.5 11/03/2017   HGBA1C 5.4 05/15/2015   LABORGA Normal Upper Respiratory Flora 05/15/2011   LABORGA No Beta Hemolytic Streptococci Isolated 05/15/2011     Lab Results  Component Value Date   ALBUMIN 4.1 11/03/2017   ALBUMIN 3.9 05/15/2015   ALBUMIN 3.9 04/26/2014    Body mass index is 49.37 kg/m.  Orders:  Orders Placed This Encounter  Procedures  . Medium Joint Inj: R ankle  . XR Ankle Complete Right   No orders of the defined types were placed in this encounter.    Procedures: Medium Joint Inj: R ankle on 08/28/2018 3:13 PM Indications: pain and diagnostic evaluation Details: 22 G 1.5 in needle, anteromedial approach Medications: 2 mL lidocaine 1 %; 40 mg methylPREDNISolone acetate 40 MG/ML Outcome: tolerated well, no immediate complications Procedure, treatment alternatives, risks and benefits explained, specific risks discussed. Consent was given by the patient. Immediately prior to procedure a time out was called to verify  the correct patient, procedure, equipment, support staff and site/side marked as required. Patient was prepped and draped in the usual sterile fashion.      Clinical Data: No additional findings.  ROS:  All other systems negative, except as noted in the HPI. Review of Systems  Objective: Vital Signs: Ht 5' (1.524 m)   Wt 252 lb 12.8 oz (114.7 kg)   BMI 49.37 kg/m   Specialty Comments:  No specialty comments available.  PMFS History: Patient Active Problem List   Diagnosis Date Noted  . Hypertension 11/05/2015  . 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 Vitamin D deficiency  04/27/2014   Past Medical History:  Diagnosis Date  . Bronchitis   . Hypertension   . Obesity   . Personal history of kidney stones     Family History  Problem Relation Age of Onset  . Hypertension Mother   . Hyperlipidemia Mother   . Depression Father   . Myasthenia gravis Father   . Cancer Father        pancreatic cancer  . Diabetes Father   . Hyperlipidemia Father   . Mental illness Father   . Cancer Sister        breast cancer  . Hyperlipidemia Sister   . Hypertension Sister   . Cancer Paternal Aunt        breast cancer  . Mental illness Paternal Grandmother   . Stroke Paternal Grandmother   . Hyperlipidemia Paternal Grandfather   . Cancer Paternal Grandfather   . Cancer Maternal Grandfather     Past Surgical History:  Procedure Laterality Date  . ANTERIOR CRUCIATE LIGAMENT REPAIR Right 09/30/2015   Procedure: RIGHT RECONSTRUCTION ANTERIOR CRUCIATE LIGAMENT (ACL) WITH HAMSTRING GRAFT;  Surgeon: Barbara Giles;  Location: [facility];  Service: Orthopedics;  Laterality: Right;  . MENISECTOMY Right 09/30/2015   Procedure: PARTIAL MEDIAL MENISCECTOMY;  Surgeon: Barbara Giles;  Location: [facility];  Service: Orthopedics;  Laterality: Right;  . 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 Smoking status: Never Smoker  . Smokeless tobacco: Never Used  Substance and Sexual Activity  . Alcohol use: Not Currently    Comment: rarely  . Drug use: No  . Sexual activity: Not Currently    Birth control/protection: Abstinence

## 2018-08-29 ENCOUNTER — Encounter: Payer: Self-pay | Admitting: Physician Assistant

## 2018-08-29 MED ORDER — LIDOCAINE HCL 1 % IJ SOLN
2.0000 mL | INTRAMUSCULAR | Status: AC | PRN
  Administered 2018-08-28: 2 mL

## 2018-08-29 MED ORDER — METHYLPREDNISOLONE ACETATE 40 MG/ML IJ SUSP
40.0000 mg | INTRAMUSCULAR | Status: AC | PRN
  Administered 2018-08-28: 40 mg via INTRA_ARTICULAR

## 2018-10-05 ENCOUNTER — Ambulatory Visit: Payer: BLUE CROSS/BLUE SHIELD | Admitting: Physician Assistant

## 2018-10-10 ENCOUNTER — Ambulatory Visit: Payer: BLUE CROSS/BLUE SHIELD | Admitting: Family

## 2019-03-12 ENCOUNTER — Ambulatory Visit: Payer: BLUE CROSS/BLUE SHIELD | Attending: Internal Medicine

## 2019-03-12 DIAGNOSIS — Z20822 Contact with and (suspected) exposure to covid-19: Secondary | ICD-10-CM

## 2019-03-13 LAB — NOVEL CORONAVIRUS, NAA: SARS-CoV-2, NAA: NOT DETECTED

## 2019-03-15 ENCOUNTER — Ambulatory Visit: Payer: BLUE CROSS/BLUE SHIELD | Attending: Internal Medicine

## 2019-03-15 DIAGNOSIS — Z20822 Contact with and (suspected) exposure to covid-19: Secondary | ICD-10-CM

## 2019-03-17 LAB — NOVEL CORONAVIRUS, NAA: SARS-CoV-2, NAA: DETECTED — AB

## 2019-08-05 ENCOUNTER — Encounter (HOSPITAL_COMMUNITY): Payer: Self-pay | Admitting: Emergency Medicine

## 2019-08-05 ENCOUNTER — Emergency Department (HOSPITAL_COMMUNITY)
Admission: EM | Admit: 2019-08-05 | Discharge: 2019-08-05 | Disposition: A | Discharge status: Home / Self Care | Payer: BLUE CROSS/BLUE SHIELD | Attending: Emergency Medicine | Admitting: Emergency Medicine

## 2019-08-05 DIAGNOSIS — G8929 Other chronic pain: Secondary | ICD-10-CM | POA: Diagnosis not present

## 2019-08-05 DIAGNOSIS — M25562 Pain in left knee: Secondary | ICD-10-CM | POA: Diagnosis present

## 2019-08-05 DIAGNOSIS — Z79899 Other long term (current) drug therapy: Secondary | ICD-10-CM | POA: Insufficient documentation

## 2019-08-05 DIAGNOSIS — I10 Essential (primary) hypertension: Secondary | ICD-10-CM | POA: Insufficient documentation

## 2019-08-05 MED ORDER — KETOROLAC TROMETHAMINE 15 MG/ML IJ SOLN
15.0000 mg | Freq: Once | INTRAMUSCULAR | Status: AC
  Administered 2019-08-05: 15 mg via INTRAMUSCULAR
  Filled 2019-08-05: qty 1

## 2019-08-05 MED ORDER — PREDNISONE 20 MG PO TABS
60.0000 mg | ORAL_TABLET | Freq: Once | ORAL | Status: AC
  Administered 2019-08-05: 60 mg via ORAL
  Filled 2019-08-05: qty 3

## 2019-08-05 MED ORDER — PREDNISONE 50 MG PO TABS: 50.0000 mg | ORAL_TABLET | Freq: Every day | ORAL | 0 refills | Status: AC

## 2019-08-05 NOTE — ED Provider Notes (Signed)
[facility] EMERGENCY DEPARTMENT Provider Note   CSN: [id number] Arrival date & time: 08/05/19  2778     History Chief Complaint  Patient presents with  . Knee Pain    Barbara Giles is a 39 y.o. female presenting for evaluation of left knee pain.  Patient states she has had issues with her left knee for several months.  She is followed by [facility] orthopedics.  She developed worsening knee pain this morning.  Pain is mostly present when she straightens out her leg and flexes at the hip. Pais in in the popliteal region.  Pain does not radiate into her calf or her thigh.  She has no hip or back pain.  She denies recent fall, trauma, or injury.  She has been taking Mobic for pain control, however her last dose was yesterday.  Her doctor called in a higher dose, but she has not yet picked this up.  She denies pain in other joints.  She denies recent travel, surgeries, immobilization, history of cancer, history of previous DVT/PE, or hormone use.  Additional history obtained from chart review.  Reviewed orthopedic notes.  Patient had an MRI since her pain began, was negative for acute findings.  She is currently being treated for arthritis.  HPI     Past Medical History:  Diagnosis Date  . Bronchitis   . Hypertension   . Obesity   . Personal history of kidney stones     Patient Active Problem List   Diagnosis Date Noted  . Hypertension 11/05/2015  . 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 Vitamin D deficiency 04/27/2014    Past Surgical History:  Procedure Laterality Date  . ANTERIOR CRUCIATE LIGAMENT REPAIR Right 09/30/2015   Procedure: RIGHT RECONSTRUCTION ANTERIOR CRUCIATE LIGAMENT (ACL) WITH HAMSTRING GRAFT;  Surgeon: Cammy Copa, MD;  Location: [facility] OR;  Service: Orthopedics;  Laterality: Right;  . MENISECTOMY Right 09/30/2015   Procedure: PARTIAL MEDIAL MENISCECTOMY;  Surgeon:  Cammy Copa, MD;  Location: [facility] OR;  Service: Orthopedics;  Laterality: Right;  . TONSILLECTOMY       OB History    Gravida  1   Para  1   Term      Preterm      AB      Living        SAB      TAB      Ectopic      Multiple      Live Births              Family History  Problem Relation Age of Onset  . Hypertension Mother   . Hyperlipidemia Mother   . Depression Father   . Myasthenia gravis Father   . Cancer Father        pancreatic cancer  . Diabetes Father   . Hyperlipidemia Father   . Mental illness Father   . Cancer Sister        breast cancer  . Hyperlipidemia Sister   . Hypertension Sister   . Cancer Paternal Aunt        breast cancer  . Mental illness Paternal Grandmother   . Stroke Paternal Grandmother   . Hyperlipidemia Paternal Grandfather   . Cancer Paternal Grandfather   . Cancer Maternal Grandfather     Social History   Tobacco Use  . Smoking status: Never Smoker  . Smokeless tobacco: Never Used  Substance  Use Topics  . Alcohol use: Not Currently    Comment: rarely  . Drug use: No    Home Medications Prior to Admission medications   Medication Sig Start Date End Date Taking? Authorizing Provider  lisinopril (PRINIVIL,ZESTRIL) 10 MG tablet Take 1 tablet (10 mg total) by mouth daily. 11/03/17   Sherren Mocha, MD  lisinopril (PRINIVIL,ZESTRIL) 10 MG tablet TAKE 1 TABLET BY MOUTH DAILY 12/13/17   Sherren Mocha, MD  predniSONE (DELTASONE) 50 MG tablet Take 1 tablet (50 mg total) by mouth daily for 4 days. 08/06/19 08/10/19  Caccavale, Sophia, PA-C  Vitamin D, Ergocalciferol, (DRISDOL) 50000 units CAPS capsule Take 1 capsule (50,000 Units total) by mouth every 7 (seven) days. 12/25/17   Sherren Mocha, MD    Allergies    Patient has no known allergies.  Review of Systems   Review of Systems  Musculoskeletal: Positive for arthralgias.  Neurological: Negative for numbness.    Physical Exam Updated Vital Signs BP 123/68 (BP  Location: Right Arm)   Pulse 76   Temp 98.2 F (36.8 C) (Oral)   Resp 14   Ht 5' (1.524 m)   Wt 113.4 kg   SpO2 100%   BMI 48.82 kg/m   Physical Exam Vitals and nursing note reviewed.  Constitutional:      General: She is not in acute distress.    Appearance: She is well-developed. She is obese.     Comments: Obese female sitting comfortably in the bed in no acute distress  HENT:     Head: Normocephalic and atraumatic.  Pulmonary:     Effort: Pulmonary effort is normal.  Abdominal:     General: There is no distension.  Musculoskeletal:        General: Normal range of motion.     Cervical back: Normal range of motion.     Comments: No obvious deformity.  No erythema, warmth, or swelling.  No calf tenderness.  Pedal pulses 2+ bilaterally. No pain with flexion of the knee, mild pain with extension of the knee.  Increased pain of the knee with straight leg raise. ambulatory  Skin:    General: Skin is warm.     Capillary Refill: Capillary refill takes less than 2 seconds.     Findings: No rash.  Neurological:     Mental Status: She is alert and oriented to person, place, and time.     ED Results / Procedures / Treatments   Labs (all labs ordered are listed, but only abnormal results are displayed) Labs Reviewed - No data to display  EKG None  Radiology No results found.  Procedures Procedures (including critical care time)  Medications Ordered in ED Medications  ketorolac (TORADOL) 15 MG/ML injection 15 mg (has no administration in time range)  predniSONE (DELTASONE) tablet 60 mg (has no administration in time range)    ED Course  I have reviewed the triage vital signs and the nursing notes.  Pertinent labs & imaging results that were available during my care of the patient were reviewed by me and considered in my medical decision making (see chart for details).    MDM Rules/Calculators/A&P                      Patient presenting for evaluation of left  knee pain.  On exam, patient peers nontoxic.  Exam is not consistent with septic joint.  History and exam is not consistent with a DVT.  No recent  trauma or injury.  As such, I do not believe x-rays to be beneficial as I have low suspicion for fracture dislocation.  Likely acute on chronic pain due to arthritis of the knee.  This is likely multifactorial due to not having any more medication and weather change.  Will give a Toradol shot for pain control.  Will place patient on a prednisone burst in response to the flare, and have her follow-up with orthopedics.  Courage her to restart her Mobic when the prednisone is done.  Will give her a knee sleeve for support and pain control.  At this time, patient appears safe for discharge.  Return precautions given.  Patient states she understands and agrees to plan.  Final Clinical Impression(s) / ED Diagnoses Final diagnoses:  Chronic pain of left knee    Rx / DC Orders ED Discharge Orders         Ordered    predniSONE (DELTASONE) 50 MG tablet  Daily     08/05/19 1014           [place], Moro, PA-C 08/05/19 1023    Quintella Reichert, MD 08/05/19 [phone]

## 2019-08-05 NOTE — Discharge Instructions (Signed)
Continue taking home medications as prescribed. Take prednisone as prescribed for the next 4 days, starting tomorrow. Use the knee sleeve as needed for support and pain control. Use ice to help with pain and swelling. Continue taking Tylenol to help with pain control.  Do not take other anti-inflammatories such as ibuprofen, Advil, naproxen, Aleve. Restart the Mobic after you finish prednisone. Follow-up with your orthopedic doctor if your symptoms not improving. Return to the emergency room if you develop fevers, if your knee becomes red hot and swollen, if you develop severe pain or swelling in your calf, or with any new, worsening, or concerning symptoms.

## 2019-08-05 NOTE — ED Triage Notes (Signed)
Pt reports ongoing L knee pain that she has been seeing her doctor for and been treated with prescription strength anti inflammatory medication. States she had an MRI about a month ago that did not show any significant findings. Recently knee has become more swollen and painful, denies injury or trauma, no redness or warmth. States she contacted her doctor who increased her medication but she has not been able to pick up the new dosage yet.

## 2021-07-11 ENCOUNTER — Encounter (HOSPITAL_COMMUNITY): Payer: Self-pay

## 2021-07-11 ENCOUNTER — Ambulatory Visit (HOSPITAL_COMMUNITY)
Admission: EM | Admit: 2021-07-11 | Discharge: 2021-07-11 | Disposition: A | Discharge status: Home / Self Care | Payer: BLUE CROSS/BLUE SHIELD | Attending: Emergency Medicine | Admitting: Emergency Medicine

## 2021-07-11 DIAGNOSIS — R1031 Right lower quadrant pain: Secondary | ICD-10-CM | POA: Insufficient documentation

## 2021-07-11 DIAGNOSIS — R3129 Other microscopic hematuria: Secondary | ICD-10-CM | POA: Insufficient documentation

## 2021-07-11 LAB — POCT URINALYSIS DIPSTICK, ED / UC
Bilirubin Urine: NEGATIVE
Glucose, UA: NEGATIVE mg/dL
Ketones, ur: NEGATIVE mg/dL
Nitrite: NEGATIVE
Protein, ur: 100 mg/dL — AB
Specific Gravity, Urine: 1.025 (ref 1.005–1.030)
Urobilinogen, UA: 0.2 mg/dL (ref 0.0–1.0)
pH: 6 (ref 5.0–8.0)

## 2021-07-11 LAB — POC URINE PREG, ED: Preg Test, Ur: NEGATIVE

## 2021-07-11 MED ORDER — TAMSULOSIN HCL 0.4 MG PO CAPS: 0.4000 mg | ORAL_CAPSULE | Freq: Every day | ORAL | 0 refills | Status: AC

## 2021-07-11 NOTE — Discharge Instructions (Signed)
Take medication as prescribed. Please follow up with your primary care for further evaluation. ? ?Please return to the urgent care or emergency department if symptoms worsen or do not improve. ? ?

## 2021-07-11 NOTE — ED Triage Notes (Signed)
Onset today of "intense" right lower abdominal pain that radiates into her lower right back. Intense pain lasted for >1hr and has since decreased some. Confirms nausea and diarrhea. Has been taking tums and gas-x w/o relief. No emesis.  ?

## 2021-07-11 NOTE — ED Provider Notes (Signed)
?[facility] ? ? ? ?CSN: [id number] ?Arrival date & time: 07/11/21  1747 ? ?  ? ?History   ?Chief Complaint ?Chief Complaint  ?Patient presents with  ? Abdominal Pain  ? ? ?HPI ?Barbara Giles is a 41 y.o. female.  ?She presents with lower right quadrant pain that radiates to her right flank.  She states that this began around 2 PM today.  She notes the pain has been intermittent on and off sometimes severe.  States the pain is now 5 out of 10 in clinic today.  She took a Celebrex this morning, which she takes twice daily.  She was nauseous earlier and took a Zofran which helped her. Denies vomiting. Patient states she has had a kidney stone in the past but only had flank pain with that episode.  She denies fever, dysuria, blood in the urine, changes in bowel movements. ?LMP is unknown. She started birth control pills 1 year ago and has not had a period since. Denies possibility of pregnancy.  She has had ruptured ovarian cysts in the past.  ? ?Past Medical History:  ?Diagnosis Date  ? Bronchitis   ? Hypertension   ? Obesity   ? Personal history of kidney stones   ? ? ?Patient Active Problem List  ? Diagnosis Date Noted  ? Hypertension 11/05/2015  ? Atypical squamous cell changes of undetermined significance favor benign (ASCUS favor benign) 05/23/2014  ? Vitamin D deficiency 04/27/2014  ? ? ?Past Surgical History:  ?Procedure Laterality Date  ? ANTERIOR CRUCIATE LIGAMENT REPAIR Right 09/30/2015  ? Procedure: RIGHT RECONSTRUCTION ANTERIOR CRUCIATE LIGAMENT (ACL) WITH HAMSTRING GRAFT;  Surgeon: Cammy Copa, MD;  Location: [facility] OR;  Service: Orthopedics;  Laterality: Right;  ? MENISECTOMY Right 09/30/2015  ? Procedure: PARTIAL MEDIAL MENISCECTOMY;  Surgeon: Cammy Copa, MD;  Location: [facility] OR;  Service: Orthopedics;  Laterality: Right;  ? TONSILLECTOMY    ? ? ?OB History   ? ? Gravida  ?1  ? Para  ?1  ? Term  ?   ? Preterm  ?   ? AB  ?   ? Living  ?   ?  ? ? SAB  ?   ? IAB  ?   ? Ectopic  ?   ?  Multiple  ?   ? Live Births  ?   ?   ?  ?  ? ? ? ?Home Medications   ? ?Prior to Admission medications   ?Medication Sig Start Date End Date Taking? Authorizing Provider  ?celecoxib (CELEBREX) 200 MG capsule TAKE ONE CAPSULE BY MOUTH TWICE A DAY FOR 30 DAYS; TAKE WITH FOOD TO AVOID GI UPSET ! DO NOT TAKE WITH OTHER NON-STEROIDAL ANTI-INFLAMMATORY DRUGS ! 07/09/21  Yes [provider]  ?norethindrone (MICRONOR) 0.35 MG tablet Take 1 tablet by mouth daily. 02/06/21  Yes [provider]  ?tamsulosin (FLOMAX) 0.4 MG CAPS capsule Take 1 capsule (0.4 mg total) by mouth at bedtime for 10 days. 07/11/21 07/21/21 Yes Dajuana Palen, Lurena Joiner, PA-C  ?lisinopril (PRINIVIL,ZESTRIL) 10 MG tablet Take 1 tablet (10 mg total) by mouth daily. 11/03/17   Sherren Mocha, MD  ?lisinopril (PRINIVIL,ZESTRIL) 10 MG tablet TAKE 1 TABLET BY MOUTH DAILY 12/13/17   Sherren Mocha, MD  ?Vitamin D, Ergocalciferol, (DRISDOL) 50000 units CAPS capsule Take 1 capsule (50,000 Units total) by mouth every 7 (seven) days. 12/25/17   Sherren Mocha, MD  ? ? ?Family History ?Family History  ?Problem Relation Age  of Onset  ? Hypertension Mother   ? Hyperlipidemia Mother   ? Depression Father   ? Myasthenia gravis Father   ? Cancer Father   ?     pancreatic cancer  ? Diabetes Father   ? Hyperlipidemia Father   ? Mental illness Father   ? Cancer Sister   ?     breast cancer  ? Hyperlipidemia Sister   ? Hypertension Sister   ? Cancer Paternal Aunt   ?     breast cancer  ? Mental illness Paternal Grandmother   ? Stroke Paternal Grandmother   ? Hyperlipidemia Paternal Grandfather   ? Cancer Paternal Grandfather   ? Cancer Maternal Grandfather   ? ? ?Social History ?Social History  ? ?Tobacco Use  ? Smoking status: Never  ? Smokeless tobacco: Never  ?Vaping Use  ? Vaping Use: Never used  ?Substance Use Topics  ? Alcohol use: Not Currently  ?  Comment: rarely  ? Drug use: No  ? ? ? ?Allergies   ?Patient has no known allergies. ? ? ?Review of Systems ?Review of Systems   ?Constitutional: Negative.   ?Gastrointestinal:  Positive for abdominal pain and nausea.  ?Genitourinary:  Positive for flank pain. Negative for hematuria.  ?All other systems reviewed and are negative. ? ?As per HPI ? ?Physical Exam ?Triage Vital Signs ?ED Triage Vitals [07/11/21 1818]  ?Enc Vitals Group  ?   BP 113/74  ?   Pulse Rate 75  ?   Resp 18  ?   Temp 98.2 ?F (36.8 ?C)  ?   Temp Source Oral  ?   SpO2 98 %  ?   Weight   ?   Height   ?   Head Circumference   ?   Peak Flow   ?   Pain Score 6  ?   Pain Loc   ?   Pain Edu?   ?   Excl. in GC?   ? ?No data found. ? ?Updated Vital Signs ?BP 113/74 (BP Location: Right Arm)   Pulse 75   Temp 98.2 ?F (36.8 ?C) (Oral)   Resp 18   SpO2 98%  ? ? ?Physical Exam ?Vitals and nursing note reviewed.  ?Cardiovascular:  ?   Rate and Rhythm: Normal rate and regular rhythm.  ?   Heart sounds: Normal heart sounds.  ?Pulmonary:  ?   Effort: Pulmonary effort is normal.  ?   Breath sounds: Normal breath sounds.  ?Abdominal:  ?   General: Bowel sounds are normal. There is no abdominal bruit.  ?   Palpations: Abdomen is soft. There is no hepatomegaly or splenomegaly.  ?   Tenderness: There is no abdominal tenderness. There is no right CVA tenderness or left CVA tenderness.  ?Genitourinary: ?   Comments: Deferred.  Denies vaginal symptoms ? ? ?UC Treatments / Results  ?Labs ?(all labs ordered are listed, but only abnormal results are displayed) ?Labs Reviewed  ?POCT URINALYSIS DIPSTICK, ED / UC - Abnormal; Notable for the following components:  ?    Result Value  ? Hgb urine dipstick LARGE (*)   ? Protein, ur 100 (*)   ? Leukocytes,Ua SMALL (*)   ? All other components within normal limits  ?URINE CULTURE  ?POC URINE PREG, ED  ? ? ?EKG ? ?Radiology ?No results found. ? ?Procedures ?Procedures (including critical care time) ? ?Medications Ordered in UC ?Medications - No data to display ? ?Initial Impression / Assessment and Plan /  UC Course  ?I have reviewed the triage vital signs  and the nursing notes. ? ?Pertinent labs & imaging results that were available during my care of the patient were reviewed by me and considered in my medical decision making (see chart for details). ? ?Urinalysis in clinic today shows large mount of hemoglobin.  Also small leukocytes, will send for culture.  Due to flank pain seeming to radiate into the abdomen and microscopic hematuria suspect this could be a kidney stone.  Due to the location of her pain this could also be ovarian in nature.  No masses were felt on exam, and abdomen was nontender to palpation.  Patient is alert and active and states her pain is controlled.  Discussed with patient that she will need to follow-up with her primary care in order to obtain outpatient imaging.  She has an appointment scheduled with her primary care for this coming Thursday.  Prescribed Flomax nightly. ?Discussed return precautions and patient agrees to plan.  She is discharged in stable condition. ? ?Final Clinical Impressions(s) / UC Diagnoses  ? ?Final diagnoses:  ?Right lower quadrant abdominal pain  ?Other microscopic hematuria  ? ? ? ?Discharge Instructions   ? ?  ?Take medication as prescribed. Please follow up with your primary care for further evaluation. ? ?Please return to the urgent care or emergency department if symptoms worsen or do not improve. ? ? ? ? ? ?ED Prescriptions   ? ? Medication Sig Dispense Auth. Provider  ? tamsulosin (FLOMAX) 0.4 MG CAPS capsule Take 1 capsule (0.4 mg total) by mouth at bedtime for 10 days. 10 capsule Dion Parrow, Lurena Joiner, PA-C  ? ?  ? ?PDMP not reviewed this encounter. ?  ?Temitope Flammer, Lurena Joiner, PA-C ?07/11/21 1930 ? ?

## 2021-07-12 LAB — URINE CULTURE
